# Patient Record
Sex: Female | Born: 1965 | Hispanic: Yes | Marital: Married | Smoking: Never smoker
Health system: Southern US, Community
[De-identification: ages and names within clinical notes are randomized; demographics above are authoritative.]

## PROBLEM LIST (undated history)

## (undated) DIAGNOSIS — E7800 Pure hypercholesterolemia, unspecified: Secondary | ICD-10-CM

## (undated) DIAGNOSIS — I10 Essential (primary) hypertension: Secondary | ICD-10-CM

## (undated) HISTORY — PX: TUBAL LIGATION: SHX77

---

## 2011-12-09 DIAGNOSIS — E7800 Pure hypercholesterolemia, unspecified: Secondary | ICD-10-CM | POA: Insufficient documentation

## 2011-12-09 DIAGNOSIS — S335XXA Sprain of ligaments of lumbar spine, initial encounter: Secondary | ICD-10-CM | POA: Insufficient documentation

## 2011-12-09 DIAGNOSIS — Y9289 Other specified places as the place of occurrence of the external cause: Secondary | ICD-10-CM | POA: Insufficient documentation

## 2011-12-09 DIAGNOSIS — X500XXA Overexertion from strenuous movement or load, initial encounter: Secondary | ICD-10-CM | POA: Insufficient documentation

## 2011-12-09 DIAGNOSIS — I10 Essential (primary) hypertension: Secondary | ICD-10-CM | POA: Insufficient documentation

## 2011-12-09 DIAGNOSIS — Y990 Civilian activity done for income or pay: Secondary | ICD-10-CM | POA: Insufficient documentation

## 2011-12-10 ENCOUNTER — Emergency Department (HOSPITAL_COMMUNITY)
Admission: EM | Admit: 2011-12-10 | Discharge: 2011-12-10 | Disposition: A | Discharge status: Home / Self Care | Payer: Worker's Compensation | Attending: Emergency Medicine | Admitting: Emergency Medicine

## 2011-12-10 ENCOUNTER — Encounter (HOSPITAL_COMMUNITY): Payer: Self-pay | Admitting: Emergency Medicine

## 2011-12-10 DIAGNOSIS — S39012A Strain of muscle, fascia and tendon of lower back, initial encounter: Secondary | ICD-10-CM

## 2011-12-10 HISTORY — DX: Essential (primary) hypertension: I10

## 2011-12-10 HISTORY — DX: Pure hypercholesterolemia, unspecified: E78.00

## 2011-12-10 MED ORDER — NAPROXEN 500 MG PO TABS: 500.0000 mg | ORAL_TABLET | Freq: Two times a day (BID) | ORAL | Status: AC

## 2011-12-10 MED ORDER — KETOROLAC TROMETHAMINE 60 MG/2ML IM SOLN
60.0000 mg | Freq: Once | INTRAMUSCULAR | Status: AC
  Administered 2011-12-10: 60 mg via INTRAMUSCULAR
  Filled 2011-12-10: qty 2

## 2011-12-10 MED ORDER — METHOCARBAMOL 500 MG PO TABS: 500.0000 mg | ORAL_TABLET | Freq: Two times a day (BID) | ORAL | Status: AC | PRN

## 2011-12-10 NOTE — ED Provider Notes (Signed)
History     CSN: [id number]  Arrival date & time 12/09/11  2319   First MD Initiated Contact with Patient 12/10/11 0117      Chief Complaint  Patient presents with  . Back Pain    (Consider location/radiation/quality/duration/timing/severity/associated sxs/prior treatment) HPI Comments: 46 year old female who presents with lower back pain which is on the right lower back. This was gradual in onset 4 days prior to arrival when she was lifting heavy items at work. The pain has been persistent, is relieved when she lays down and worsens when she turns or rotates or bends at the back. She denies associated weakness, numbness, ataxia, dysuria, fever, history of cancer, urinary retention or incontinence. She denies IV drug use. She has had no medications prior to arrival  Patient is a 46 y.o. female presenting with back pain. The history is provided by the patient and a relative. The history is limited by a language barrier. A language interpreter was used.  Back Pain  Pertinent negatives include no fever, no numbness and no weakness.    Past Medical History  Diagnosis Date  . Hypertension   . High cholesterol     Past Surgical History  Procedure Date  . Tubal ligation     History reviewed. No pertinent family history.  History  Substance Use Topics  . Smoking status: Never Smoker   . Smokeless tobacco: Not on file  . Alcohol Use: No    OB History    Grav Para Term Preterm Abortions TAB SAB Ect Mult Living                  Review of Systems  Constitutional: Negative for fever and chills.  HENT: Negative for neck pain.   Cardiovascular: Negative for leg swelling.  Gastrointestinal: Negative for nausea and vomiting.       No incontinence of bowel  Genitourinary: Negative for difficulty urinating.       No incontinence or retention  Musculoskeletal: Positive for back pain.  Skin: Negative for rash.  Neurological: Negative for weakness and numbness.    Allergies    Review of patient's allergies indicates no known allergies.  Home Medications   Current Outpatient Rx  Name Route Sig Dispense Refill  . ACETAMINOPHEN 325 MG PO TABS Oral Take 650 mg by mouth every 6 (six) hours as needed. Pain    . IBUPROFEN 200 MG PO TABS Oral Take 400 mg by mouth every 6 (six) hours as needed. Pain    . METHOCARBAMOL 500 MG PO TABS Oral Take 1 tablet (500 mg total) by mouth 2 (two) times daily as needed. 20 tablet 0  . NAPROXEN 500 MG PO TABS Oral Take 1 tablet (500 mg total) by mouth 2 (two) times daily with a meal. 30 tablet 0    BP 122/76  Pulse 78  Temp(Src) 98.4 F (36.9 C) (Oral)  Resp 16  SpO2 100%  LMP 11/13/2011  Physical Exam  Nursing note and vitals reviewed. Constitutional: She appears well-developed and well-nourished. No distress.  HENT:  Head: Normocephalic and atraumatic.  Eyes: Conjunctivae are normal. Right eye exhibits no discharge. Left eye exhibits no discharge. No scleral icterus.  Cardiovascular: Normal rate and regular rhythm.   No murmur heard. Pulmonary/Chest: Effort normal and breath sounds normal.  Musculoskeletal: She exhibits tenderness ( Focal tenderness to palpation over the right lower back, no spinal tenderness to palpation). She exhibits no edema.  Neurological:       Normal strength and  sensation and gait of the lower extremities.  Skin: Skin is warm and dry. She is not diaphoretic.    ED Course  Procedures (including critical care time)  Labs Reviewed - No data to display No results found.   1. Lumbar strain       MDM  No focal neurologic defects, vital signs normal, no urinary symptoms, no red flags for pathologic back pain. Intramuscular Toradol given, home with Naprosyn and Robaxin and followup.  Discharge Prescriptions include:  #1 Naprosyn #2 Toradol        Vida Roller, MD 12/10/11 [phone]

## 2011-12-10 NOTE — ED Notes (Signed)
Family states pt states she was at work and was moving some plates from the freezer and after that she started having pain in her lower back

## 2011-12-10 NOTE — Discharge Instructions (Signed)
Your back pain should be treated with medicines such as ibuprofen or aleve and this back pain should get better over the next 2 weeks.  However if you develop severe or worsening pain, low back pain with fever, numbness, weakness or inability to walk or urinate, you should return to the ER immediately.  Please follow up with your doctor this week for a recheck if still having symptoms.  RESOURCE GUIDE  Dental Problems  Patients with Medicaid: [facility] [phone] [address]                                           [phone] [address] Phone:  [phone]                                                  Phone:  [phone]  If unable to pay or uninsured, contact:  [facility] or [facility]. to become qualified for the adult dental clinic.  Chronic Pain Problems Contact [facility]  [phone] Patients need to be referred by their primary care doctor.  Insufficient Money for Medicine Contact [employer]:  call "211" or [facility] [phone].  No Primary Care Doctor Call [facility]  [phone] Other agencies that provide inexpensive medical care    [facility]  [phone]    [facility]  [phone]    [facility]  [phone]    [facility]  [phone]    [facility]  [phone]    [facility]  [phone]  [facility]al [facility]  [phone] [facility]  [phone] [facility]   [phone] (emergency services [phone])  Substance Abuse Resources [facility]  [phone] [facility] [phone] [facility] [phone] Floydene Flock [phone] Residential & Outpatient Substance Abuse Program  [phone]  Abuse/Neglect [facility] Child Abuse Hotline [phone] [facility] Child Abuse Hotline [phone] (After Hours)  Emergency  Shelter [facility] [phone]  [facility] [place] [facility] [phone] [facility] [phone]  MRSA Hotline #:   5134417742    [facility] Resources  [facility]     [employer]                          [facility] Dept. [address] [place]                       [address]      [address] [place] [address]  Patrecia Pace  [facility] Phone:  [phone]                                   Phone:  [phone]                 Phone:  [place] Phone:  [place] [phone] (After Hours)

## 2012-01-31 ENCOUNTER — Ambulatory Visit (INDEPENDENT_AMBULATORY_CARE_PROVIDER_SITE_OTHER): Payer: Self-pay | Admitting: Family Medicine

## 2012-01-31 VITALS — BP 121/83 | HR 105 | Temp 98.4°F | Resp 18 | Ht 63.0 in | Wt 180.0 lb

## 2012-01-31 DIAGNOSIS — R300 Dysuria: Secondary | ICD-10-CM

## 2012-01-31 DIAGNOSIS — N390 Urinary tract infection, site not specified: Secondary | ICD-10-CM

## 2012-01-31 LAB — POCT URINALYSIS DIPSTICK
Bilirubin, UA: NEGATIVE
Glucose, UA: NEGATIVE
Ketones, UA: NEGATIVE
Spec Grav, UA: <=1.005

## 2012-01-31 LAB — POCT UA - MICROSCOPIC ONLY
Casts, Ur, LPF, POC: NEGATIVE
Mucus, UA: NEGATIVE

## 2012-01-31 MED ORDER — CIPROFLOXACIN HCL 250 MG PO TABS: 250.0000 mg | ORAL_TABLET | Freq: Two times a day (BID) | ORAL | Status: AC

## 2012-01-31 MED ORDER — CIPROFLOXACIN HCL 250 MG PO TABS: 500.0000 mg | ORAL_TABLET | Freq: Two times a day (BID) | ORAL | Status: DC

## 2012-01-31 MED ORDER — PHENAZOPYRIDINE HCL 200 MG PO TABS: 200.0000 mg | ORAL_TABLET | Freq: Three times a day (TID) | ORAL | Status: AC | PRN

## 2012-01-31 NOTE — Patient Instructions (Signed)
Infeccin del tracto urinario (Urinary Tract Infection) Las infecciones en el tracto urinario pueden comenzar en varios lugares. Una infeccin en la vejiga (cistitis), una infeccin en el rin (pielonefritis) o una infeccin en la prstata (prostatitis) son diferentes tipos de infeccin del tracto urinario. Por lo general mejoran si se los trata con antibiticos. Los antibiticos son medicamentos que matan grmenes. Tome todos los medicamentos que le han recetado hasta que se terminen. Podr sentirse bien dentro de unos das, pero DEBE TOMAR LOS MEDICAMENTOS HASTA TERMINAR EL TRATAMIENTO, de lo contrario la infeccin puede no solucionarse y luego ser ms difcil de tratar. INSTRUCCIONES PARA EL CUIDADO DOMICILIARIO  Beba gran cantidad de lquidos para mantener la orina de tono claro o color amarillo plido. Se recomienda especialmente el jugo de arndanos rojos, adems de grandes cantidades de agua.   Evite la cafena, el t y las bebidas con gas. Estas sustancias irritan la vejiga.   El alcohol puede irritar la prstata.   Utilice los medicamentos de venta libre o de prescripcin para el dolor, el malestar o la fiebre, segn se lo indique el profesional que lo asiste.  PARA PREVENIR FUTURAS INFECCIONES:  Vace la vejiga con frecuencia. Evite retener la orina durante largos perodos.   Despus de mover el intestino, las mujeres deben higienizarse la regin perineal desde adelante hacia atrs. Use cada papel tissue slo una vez.   Vace la vejiga antes y despus de tener relaciones sexuales.  OBTENER LOS RESULTADOS DE LAS PRUEBAS Durante su visita no contar con todos los resultados de los anlisis. En este caso, tenga otra entrevista con su mdico para conocerlos. No piense que el resultado es normal si no tiene noticias de su mdico o de la institucin mdica. Es importante el seguimiento de todos los resultados de los anlisis.  SOLICITE ATENCIN MDICA SI:  Siente dolor en la espalda.    El beb tiene ms de 3 meses y su temperatura rectal es de 100.5 F (38.1 C) o ms durante ms de 1 da.   Los problemas (sntomas) no mejoran en 3 das. Solicite atencin mdica antes si empeora.  SOLICITE ATENCIN MDICA DE INMEDIATO SI:  Comienza a sentir un dolor de espaldas o en la zona abdominal inferior intenso.   Comienza a sentir escalofros.   Tiene fiebre.   Su beb tiene ms de 3 meses y su temperatura rectal es de 102 F (38.9 C) o mayor.   Su beb tiene 3 meses o menos y su temperatura rectal es de 100.4 F (38 C) o mayor.   Siente nuseas o vmitos.   Tiene una sensacin continua de quemazn o molestias al orinar.  EST SEGURO QUE:  Comprende las instrucciones para el alta mdica.   Controlar su enfermedad.   Solicitar atencin mdica de inmediato segn las indicaciones.  Document Released: 06/11/2005 Document Revised: 08/21/2011 ExitCare Patient Information 2012 [employer]. 

## 2012-01-31 NOTE — Progress Notes (Signed)
    Results for orders placed in visit on 01/31/12  POCT UA - MICROSCOPIC ONLY      Component Value Range   WBC, Ur, HPF, POC tntc     RBC, urine, microscopic tntc     Bacteria, U Microscopic 3+     Mucus, UA neg     Epithelial cells, urine per micros 0-1     Crystals, Ur, HPF, POC neg     Casts, Ur, LPF, POC neg     Yeast, UA neg    POCT URINALYSIS DIPSTICK      Component Value Range   Color, UA pink     Clarity, UA cloudy     Glucose, UA neg     Bilirubin, UA neg     Ketones, UA neg     Spec Grav, UA <=1.005     Blood, UA large     pH, UA 6.5     Protein, UA 30     Urobilinogen, UA 0.2     Nitrite, UA neg     Leukocytes, UA moderate (2+)

## 2012-07-19 ENCOUNTER — Ambulatory Visit: Payer: Self-pay | Admitting: Family Medicine

## 2012-07-19 VITALS — BP 128/82 | HR 61 | Temp 97.7°F | Resp 16 | Ht 64.0 in | Wt 181.0 lb

## 2012-07-19 DIAGNOSIS — Z124 Encounter for screening for malignant neoplasm of cervix: Secondary | ICD-10-CM

## 2012-07-19 DIAGNOSIS — N898 Other specified noninflammatory disorders of vagina: Secondary | ICD-10-CM

## 2012-07-19 DIAGNOSIS — Z13220 Encounter for screening for lipoid disorders: Secondary | ICD-10-CM

## 2012-07-19 DIAGNOSIS — R109 Unspecified abdominal pain: Secondary | ICD-10-CM

## 2012-07-19 DIAGNOSIS — N912 Amenorrhea, unspecified: Secondary | ICD-10-CM

## 2012-07-19 LAB — POCT URINALYSIS DIPSTICK
Bilirubin, UA: NEGATIVE
Glucose, UA: NEGATIVE
Ketones, UA: NEGATIVE
Leukocytes, UA: NEGATIVE
Nitrite, UA: NEGATIVE
Protein, UA: NEGATIVE
Spec Grav, UA: 1.01
Urobilinogen, UA: 0.2
pH, UA: 6

## 2012-07-19 LAB — LIPID PANEL
Cholesterol: 323 mg/dL — ABNORMAL HIGH (ref 0–200)
Total CHOL/HDL Ratio: 7.3 Ratio

## 2012-07-19 LAB — POCT CBC
Granulocyte percent: 63.9 %G (ref 37–80)
MCHC: 30.8 g/dL — AB (ref 31.8–35.4)
MCV: 84.8 fL (ref 80–97)
MID (cbc): 0.4 (ref 0–0.9)
POC Granulocyte: 5.1 (ref 2–6.9)
POC LYMPH PERCENT: 30.5 %L (ref 10–50)
Platelet Count, POC: 328 10*3/uL (ref 142–424)
RDW, POC: 14.7 %

## 2012-07-19 LAB — POCT UA - MICROSCOPIC ONLY
Casts, Ur, LPF, POC: NEGATIVE
Crystals, Ur, HPF, POC: NEGATIVE
Mucus, UA: NEGATIVE
Yeast, UA: NEGATIVE

## 2012-07-19 LAB — POCT WET PREP WITH KOH
KOH Prep POC: POSITIVE
Trichomonas, UA: NEGATIVE

## 2012-07-19 LAB — BASIC METABOLIC PANEL
BUN: 8 mg/dL (ref 6–23)
CO2: 25 mEq/L (ref 19–32)
Chloride: 101 mEq/L (ref 96–112)
Glucose, Bld: 86 mg/dL (ref 70–99)
Potassium: 3.9 mEq/L (ref 3.5–5.3)
Sodium: 138 mEq/L (ref 135–145)

## 2012-07-19 LAB — POCT URINE PREGNANCY: Preg Test, Ur: NEGATIVE

## 2012-07-19 LAB — TSH: TSH: 1.97 u[IU]/mL (ref 0.350–4.500)

## 2012-07-19 MED ORDER — CIPROFLOXACIN HCL 250 MG PO TABS: 250.0000 mg | ORAL_TABLET | Freq: Two times a day (BID) | ORAL | Status: DC

## 2012-07-19 MED ORDER — FLUCONAZOLE 150 MG PO TABS: 150.0000 mg | ORAL_TABLET | Freq: Once | ORAL | Status: DC

## 2012-07-19 NOTE — Patient Instructions (Addendum)
You may be developing a urinary tract infection. Please use the cipro antibiotic and also the pill for a vaginal yeast infection.  Otherwise I do not see a definite reason for your abdominal and back pain.  If you are not getting better in the next 24 hours please call or come back in- in that case we may need to do an ultrasound or CT scan.

## 2012-07-19 NOTE — Progress Notes (Signed)
[facility] [address] [place] [address] [phone]  Date:  07/19/2012   Name:  Cathy Duran   DOB:  1966-06-08   MRN:  [id number]  PCP:  No primary provider on file.    Chief Complaint: Dysmenorrhea, Headache and Abdominal Pain   History of Present Illness:  Cathy Duran is a 46 y.o. very pleasant female patient who presents with the following:  She is here today for a few different problems.  Her LMP was about 2 months ago-  in August.  She has not had any bleeding since then. Her menses had been regular in the past.    She also notes headaches for about 2 weeks.  These occur nearly every day.  She has pain in her back, and pain in her lower abdomen.  She has noted pain in her lower abdomen since yesterday- she feels bloated like she needs to have a bowel movement.  No dysuria.  She is having normal stools.   Last week she also had a cough which is now better.    History of BTL- no other surgical history.    She does not occasional vaginal symptoms such as itching and burning but this does not occur all the time.    She cannot sleep at night- this is not new.  She has suffered from insomnia for some time.   She is not sure of her mother's age at menopause.    They have 3 children- 24, 71, and 76 years old.   She has been able to eat ok- did not eat yet this morning because she thought she might need to be fasting. She would like for [place] to do a cholesterol panel today.  No nausea, vomiting or diarrhea.  No fever.    There is no problem list on file for this patient.   Past Medical History  Diagnosis Date  . Hypertension   . High cholesterol   . Depression     Past Surgical History  Procedure Date  . Tubal ligation     History  Substance Use Topics  . Smoking status: Never Smoker   . Smokeless tobacco: Never Used  . Alcohol Use: No    Family History  Problem Relation Age of Onset  . Diabetes Mother   . Diabetes  Father   . Hypertension Sister     No Known Allergies  Medication list has been reviewed and updated.  Current Outpatient Prescriptions on File Prior to Visit  Medication Sig Dispense Refill  . acetaminophen (TYLENOL) 325 MG tablet Take 650 mg by mouth every 6 (six) hours as needed. Pain      . ibuprofen (ADVIL,MOTRIN) 200 MG tablet Take 400 mg by mouth every 6 (six) hours as needed. Pain      . naproxen (NAPROSYN) 500 MG tablet Take 1 tablet (500 mg total) by mouth 2 (two) times daily with a meal.  30 tablet  0   Current Facility-Administered Medications on File Prior to Visit  Medication Dose Route Frequency Provider Last Rate Last Dose  . ciprofloxacin (CIPRO) tablet 500 mg  500 mg Oral BID Elvina Sidle, MD        Review of Systems:  As per HPI- otherwise negative.   Physical Examination: Filed Vitals:   07/19/12 0924  BP: 128/82  Pulse: 61  Temp: 97.7 F (36.5 C)  Resp: 16   Filed Vitals:   07/19/12 0924  Height: 5\' 4"  (1.626 m)  Weight: 181 lb (82.101 kg)   Body mass index is 31.07 kg/(m^2). Ideal Body Weight: Weight in (lb) to have BMI = 25: 145.3   GEN: WDWN, NAD, Non-toxic, A & O x 3, overweight, looks well HEENT: Atraumatic, Normocephalic. Neck supple. No masses, No LAD.  Bilateral TM wnl, oropharynx normal.  PEERL,EOMI.   Ears and Nose: No external deformity. CV: RRR, No M/G/R. No JVD. No thrill. No extra heart sounds. PULM: CTA B, no wheezes, crackles, rhonchi. No retractions. No resp. distress. No accessory muscle use. ABD: S,  ND, +BS. No rebound. No HSM. She has minimal pain in her LLQ and over her bladder EXTR: No c/c/e NEURO Normal gait. Normal strength and movement of all extremities, normal DTR PSYCH: Normally interactive. Conversant. Not depressed or anxious appearing.  Calm demeanor.  GU: normal external exam, normal cervix and no CMT.  Uterus feels normal and small.    Results for orders placed in visit on 07/19/12  POCT UA - MICROSCOPIC  ONLY      Component Value Range   WBC, Ur, HPF, POC 0-1     RBC, urine, microscopic 2-4     Bacteria, U Microscopic trace     Mucus, UA neg     Epithelial cells, urine per micros 0-3     Crystals, Ur, HPF, POC neg     Casts, Ur, LPF, POC neg     Yeast, UA neg    POCT URINALYSIS DIPSTICK      Component Value Range   Color, UA yellow     Clarity, UA clear     Glucose, UA neg     Bilirubin, UA neg     Ketones, UA neg     Spec Grav, UA 1.010     Blood, UA small     pH, UA 6.0     Protein, UA neg     Urobilinogen, UA 0.2     Nitrite, UA neg     Leukocytes, UA Negative    POCT URINE PREGNANCY      Component Value Range   Preg Test, Ur Negative    POCT CBC      Component Value Range   WBC 8.0  4.6 - 10.2 K/uL   Lymph, poc 2.4  0.6 - 3.4   POC LYMPH PERCENT 30.5  10 - 50 %L   MID (cbc) 0.4  0 - 0.9   POC MID % 5.6  0 - 12 %M   POC Granulocyte 5.1  2 - 6.9   Granulocyte percent 63.9  37 - 80 %G   RBC 5.09  4.04 - 5.48 M/uL   Hemoglobin 13.3  12.2 - 16.2 g/dL   HCT, POC 40.9  81.1 - 47.9 %   MCV 84.8  80 - 97 fL   MCH, POC 26.1 (*) 27 - 31.2 pg   MCHC 30.8 (*) 31.8 - 35.4 g/dL   RDW, POC 91.4     Platelet Count, POC 328  142 - 424 K/uL   MPV 8.8  0 - 99.8 fL  GLUCOSE, POCT (MANUAL RESULT ENTRY)      Component Value Range   POC Glucose 86  70 - 99 mg/dl  POCT WET PREP WITH KOH      Component Value Range   Trichomonas, UA Negative     Clue Cells Wet Prep HPF POC 0-3     Epithelial Wet Prep HPF POC 5-7     Yeast Wet Prep HPF POC  neg     Bacteria Wet Prep HPF POC 1+     RBC Wet Prep HPF POC 0-1     WBC Wet Prep HPF POC 0-4     KOH Prep POC Positive      Assessment and Plan: 1. Abdominal pain  POCT UA - Microscopic Only, POCT urinalysis dipstick, POCT urine pregnancy, POCT CBC, POCT glucose (manual entry), POCT Wet Prep with KOH, ciprofloxacin (CIPRO) 250 MG tablet, Urine culture, Basic metabolic panel  2. Cervical cancer screening  Pap IG, CT/NG w/ reflex HPV when  ASC-U  3. Amenorrhea  TSH  4. Vaginal discharge  fluconazole (DIFLUCAN) 150 MG tablet  5. Screening cholesterol level  Lipid panel   Cathy Duran is here with a few symptoms today.  Most concerning is her amenorrhea and abdominal pain.  Negative HCG today.  Her abdominal exam and CBC are reassuring.   She may have a mild UTI- will treat with cipro while I await her urine culture. Also will treat with diflucan for vaginal yeast infection.  Check TSH due to amenorrhea.  Could be idiopathic or stress related.  Pap also pending.  BMP to evaluate kidney function and FLP per her request.    Discussed in detail with pt and her husband.  I am not able to definitely diagnose her abdominal pain, and imaging may be necessary.  Offered to do a CT or ultrasound.  However, they would prefer to try treatment first and let me know if not better in 24 hours.  If she does not get better an ovarian cyst is the most likely cause. Double diverticulitis due to normal wbc count.  They know to call or seek care right away if she gets worse, has severe pain, nausea, vomiting or fever.    Abbe Amsterdam, MD

## 2012-07-21 ENCOUNTER — Telehealth: Payer: Self-pay | Admitting: Family Medicine

## 2012-07-21 NOTE — Telephone Encounter (Signed)
Called and LM on her cell phone- still waiting on labs. Call if any problems

## 2012-07-22 LAB — PAP IG, CT-NG, RFX HPV ASCU

## 2012-07-23 LAB — URINE CULTURE: Colony Count: 30000

## 2012-07-24 ENCOUNTER — Encounter: Payer: Self-pay | Admitting: Family Medicine

## 2014-06-19 ENCOUNTER — Ambulatory Visit (INDEPENDENT_AMBULATORY_CARE_PROVIDER_SITE_OTHER): Payer: PRIVATE HEALTH INSURANCE | Admitting: Family Medicine

## 2014-06-19 VITALS — BP 126/78 | HR 70 | Temp 98.0°F | Resp 17 | Ht 63.5 in | Wt 178.0 lb

## 2014-06-19 DIAGNOSIS — G4700 Insomnia, unspecified: Secondary | ICD-10-CM

## 2014-06-19 DIAGNOSIS — R51 Headache: Secondary | ICD-10-CM

## 2014-06-19 DIAGNOSIS — R5383 Other fatigue: Secondary | ICD-10-CM

## 2014-06-19 DIAGNOSIS — R519 Headache, unspecified: Secondary | ICD-10-CM

## 2014-06-19 LAB — COMPLETE METABOLIC PANEL WITHOUT GFR
ALT: 63 U/L — ABNORMAL HIGH (ref 0–35)
AST: 42 U/L — ABNORMAL HIGH (ref 0–37)
Albumin: 4.7 g/dL (ref 3.5–5.2)
Calcium: 9.8 mg/dL (ref 8.4–10.5)
Chloride: 101 meq/L (ref 96–112)
Potassium: 4.6 meq/L (ref 3.5–5.3)
Sodium: 137 meq/L (ref 135–145)
Total Protein: 7.7 g/dL (ref 6.0–8.3)

## 2014-06-19 LAB — COMPLETE METABOLIC PANEL WITH GFR
Alkaline Phosphatase: 100 U/L (ref 39–117)
BUN: 14 mg/dL (ref 6–23)
CO2: 29 mEq/L (ref 19–32)
Creat: 0.62 mg/dL (ref 0.50–1.10)
GFR, Est African American: >89 mL/min
GFR, Est Non African American: >89 mL/min
Glucose, Bld: 97 mg/dL (ref 70–99)
Total Bilirubin: 0.4 mg/dL (ref 0.2–1.2)

## 2014-06-19 LAB — POCT CBC
Granulocyte percent: 54 %G (ref 37–80)
HCT, POC: 43.2 % (ref 37.7–47.9)
Hemoglobin: 14.2 g/dL (ref 12.2–16.2)
Lymph, poc: 2.6 (ref 0.6–3.4)
MCH, POC: 27.5 pg (ref 27–31.2)
MCHC: 32.8 g/dL (ref 31.8–35.4)
MCV: 83.7 fL (ref 80–97)
MID (cbc): 0.5 (ref 0–0.9)
MPV: 7.7 fL (ref 0–99.8)
POC Granulocyte: 3.7 (ref 2–6.9)
POC LYMPH PERCENT: 38.9 %L (ref 10–50)
POC MID %: 7.1 % (ref 0–12)
Platelet Count, POC: 257 10*3/uL (ref 142–424)
RBC: 5.16 M/uL (ref 4.04–5.48)
RDW, POC: 13.8 %
WBC: 6.8 10*3/uL (ref 4.6–10.2)

## 2014-06-19 LAB — TSH: TSH: 2.727 u[IU]/mL (ref 0.350–4.500)

## 2014-06-19 MED ORDER — ZOLPIDEM TARTRATE 5 MG PO TABS: 5.0000 mg | ORAL_TABLET | Freq: Every evening | ORAL | Status: DC | PRN

## 2014-06-19 NOTE — Patient Instructions (Signed)
Insomnio (Insomnia) El insomnio es un trastorno frecuente en la capacidad para dormirse o para permanecer dormido. Puede ser un problema crnico o un trastorno del momento. En ambos casos es un problema frecuente. Puede tratarse de un problema del momento cuando se relaciona con alguna situacin de estrs o preocupacin. Es un trastorno crnico cuando se relaciona con situaciones de estrs durante las horas de vigilia o con malos hbitos de sueo. Con el tiempo, la privacin del sueo en s misma, puede hacer que el problema empeore. Las cosas ms pequeas se agravan debido al cansancio y a que la capacidad para enfrentarlas disminuye. CAUSAS  Estrs, ansiedad y depresin.  Malos hbitos para dormir.  Distracciones como mirar TV en la cama.  Siestas en horarios prximos a la hora de ir a dormir.  Involucrarse en conversaciones de gran carga emocional antes de ir a dormir.  Leer textos tcnicos antes de dormir.  Consumir alcohol y otros sedantes. Ellos pueden empeorar el problema. Pueden modificar los patrones de sueo normales y la normal actividad onrica.  Consumir estimulantes como cafena algunas horas antes de ir a dormir.  Sndromes dolorosos y las dificultades respiratorias pueden causar insomnio.  Realizar ejercicios a ltima hora de la noche.  El cambio en las zonas horarias puede causar trastornos del sueo (jet lag). En algunos casos se recomienda que otra persona observe sus patrones de sueo. Deben observar los perodos en los que no respira durante la noche (apnea del sueo). Tambin deben observar cunto tiempo duran esos perodos. Si vive slo o no tiene una persona de confianza que lo observe, podr concurrir a una clnica del sueo, en la que lo controlarn de manera profesional. La apnea del sueo requiere controles y tratamiento. Entregue su historia clnica al profesional que lo asiste Tambin infrmele las observaciones que sus familiares hayan hecho con respecto a sus  hbitos de sueo.  SNTOMAS  Sentir por la maana que no ha descansado lo suficiente.  Ansiedad y agitacin a la hora de dormir  Dificultad para dormirse o para continuar el sueo. TRATAMIENTO  El profesional Airelle Everding indicar un tratamiento para los trastornos subyacentes. Tambin podr aconsejarlo o ayudarlo si usted toma alcohol o se automedica con otras drogas. El tratamiento de los problemas subyacentes generalmente eliminarn los problemas de insomnio.  Podrn prescribirle medicamentos para usar durante un plazo breve. Generalmente no se recomiendan para un uso prolongado.  Generalmente no se recomiendan los medicamentos de venta libre para un uso prolongado. Podran causarle adiccin.  Puede hacer ms fcil el conciliar el sueo si realiza modificaciones en su estilo de vida tales como:  Utilizar tcnicas de relajacin que favorecen la respiracin y reducen la tensin muscular.  No practicar actividad fsica en las ltimas horas del da.  Modificar la dieta y el horario de la ltima comida. No tomar colaciones durante la noche  Trate de establecer una hora habitual para irse a dormir.  La psicoterapia puede ser de utilidad para los problemas que Devonne Lalani ocasionan estrs y preocupaciones.  Si hay un ambiente ruidoso y no puede evitarlo, puede ser til escuchar msica suave o sonidos agradables.  Suspenda los trabajos tediosos y detallados al menos una hora antes de ir a dormir. INSTRUCCIONES PARA EL CUIDADO DOMICILIARIO  Lleve un diario. Infrmele al profesional que lo asiste sus progresos. Esto incluye todos los efectos secundarios de los medicamentos. Concurra regularmente a la consulta con el profesional [place] nota de:  La hora en que se duerme.  Las horas en las   que permanece despierto durante la noche.  La calidad del sueo.  Cmo se siente al da siguiente. Esta informacin ayudar a que el profesional pueda aconsejarlo.   Levntese de la cama si permanece despierto por  ms de 15 minutos. Lea o realice alguna actividad tranquila. Mantenga las luces bajas. Espere hasta que sienta sueo y luego vuelva a la cama.  Mantenga un ritmo constante de vigilia y sueo. Evite las siestas.  Practique actividad fsica con regularidad.  Evite las distracciones en el momento de ir a dormir. Entre las distracciones se incluyen el ver televisin o realizar alguna actividad intensa o detallada, hacer las cuentas de los gastos domsticos.  Programe un ritual para irse a dormir. Mantenga una rutina familiar relacionada con el bao diario, el cepillado de los dientes, irse a la cama todas las noches a la misma hora, escuchar msica suave. La rutina aumenta el xito de conciliar el sueo ms rpido.  Use tcnicas de relajacin. Practique rutinas que favorezcan la respiracin y alivien la tensin muscular. Tambin puede ayudarlo la visualizacin de escenas pacficas. Tambin puede tratar de controlar los pensamientos problemticos o molestos si mantiene la mente ocupada con pensamientos repetitivos o aburridos, como el antiguo consejo de contar ovejas. Tambin puede ser ms creativo, e imaginar que planta hermosas flores en su jardn, una detrs de la otra.  Durante el da, trabaje para eliminar el estrs. Cuando no es posible, algunas de las sugerencias ya presentadas lo ayudarn a reducir la ansiedad que acompaa las situaciones de estrs. EST SEGURO QUE:   Comprende las instrucciones para el alta mdica.  Controlar su enfermedad.  Solicitar atencin mdica de inmediato segn las indicaciones. Document Released: 09/01/2005 Document Revised: 11/24/2011 ExitCare Patient Information 2015 [employer]. This information is not intended to replace advice given to you by your health care provider. Make sure you discuss any questions you have with your health care provider.  

## 2014-06-19 NOTE — Progress Notes (Signed)
Chief Complaint:  Chief Complaint  Patient presents with  . Insomnia  . Headache  . Night Sweats    HPI: Cathy Duran is a 48 y.o. female who is here for :   3 month history of insomnia and intermittent Ha, daytime and also nighttime, in the nighttime she has a lot of chills and night sweats.  She has not lost in weight. NO cough sxs, no recent travels, no new medicines.   HEr sister feels similar to her but is living [place]  She had last LMP 2 months ago, she is irregular. Her periods are getting less She doe snot know when her mother went through menopause   She is more hot then cold.  She denies any CP, SOB, and feels tired and no energy No abd pain, no urination issues  She denies nay neuro sxs, She deneis nubmness, tingling or gait changes, or slurred speech.   She has HA and has taken tylenol or advil, she takes 1-2 pills, 2 times int he day, she has HAs started 1 month ago, 4 times out of the week. HAs last for 5 hours or less, frontal and temporal. She has tired eyes or irritation but no n/v. Noise makes it worse. She has been getting about 3 hours, and then she wakes up, then would have interrupted sleep. She has a hard time getting to sleep and maintaining sleep.  She work sin [employer] at [employer], 8 hour shifts at 12 midnight.   Past Medical History  Diagnosis Date  . Hypertension   . High cholesterol    Past Surgical History  Procedure Laterality Date  . Tubal ligation     History   Social History  . Marital Status: Married    Spouse Name: N/A    Number of Children: N/A  . Years of Education: N/A   Social History Main Topics  . Smoking status: Never Smoker   . Smokeless tobacco: Never Used  . Alcohol Use: No  . Drug Use: No  . Sexual Activity: Yes    Birth Control/ Protection: Surgical   Other Topics Concern  . None   Social History Narrative  . None   Family History  Problem Relation Age of Onset  .  Diabetes Mother   . Diabetes Father   . Hypertension Sister    No Known Allergies Prior to Admission medications   Not on File     ROS: The patient denies fevers, chills, night sweats, unintentional weight loss, chest pain, palpitations, wheezing, dyspnea on exertion, nausea, vomiting, abdominal pain, dysuria, hematuria, melena, numbness,  or tingling.   All other systems have been reviewed and were otherwise negative with the exception of those mentioned in the HPI and as above.    PHYSICAL EXAM: Filed Vitals:   06/19/14 0853  BP: 126/78  Pulse: 70  Temp: 98 F (36.7 C)  Resp: 17   Filed Vitals:   06/19/14 0853  Height: 5' 3.5" (1.613 m)  Weight: 178 lb (80.74 kg)   Body mass index is 31.03 kg/(m^2).  General: Alert, no acute distress HEENT:  Normocephalic, atraumatic, oropharynx patent. EOMI, PERRLA, no thyroidmegaly, TM nl Cardiovascular:  Regular rate and rhythm, no rubs murmurs or gallops.  No Carotid bruits, radial pulse intact. No pedal edema.  Respiratory: Clear to auscultation bilaterally.  No wheezes, rales, or rhonchi.  No cyanosis, no use of accessory musculature GI: No organomegaly, abdomen is soft and  non-tender, positive bowel sounds.  No masses. Skin: No rashes. Neurologic: Facial musculature symmetric. CN 2-12 grossly normal Psychiatric: Patient is appropriate throughout our interaction. Lymphatic: No cervical lymphadenopathy Musculoskeletal: Gait intact.   LABS: Results for orders placed in visit on 06/19/14  POCT CBC      Result Value Ref Range   WBC 6.8  4.6 - 10.2 K/uL   Lymph, poc 2.6  0.6 - 3.4   POC LYMPH PERCENT 38.9  10 - 50 %L   MID (cbc) 0.5  0 - 0.9   POC MID % 7.1  0 - 12 %M   POC Granulocyte 3.7  2 - 6.9   Granulocyte percent 54.0  37 - 80 %G   RBC 5.16  4.04 - 5.48 M/uL   Hemoglobin 14.2  12.2 - 16.2 g/dL   HCT, POC 13.243.2  44.037.7 - 47.9 %   MCV 83.7  80 - 97 fL   MCH, POC 27.5  27 - 31.2 pg   MCHC 32.8  31.8 - 35.4 g/dL   RDW,  POC 10.213.8     Platelet Count, POC 257  142 - 424 K/uL   MPV 7.7  0 - 99.8 fL     EKG/XRAY:   Primary [facility]   ASSESSMENT/PLAN: Encounter Diagnoses  Name Primary?  . Insomnia Yes  . Other fatigue   . Headache in front of head    Labs pending: CMP and TSH Will rx ambien 5 mg and see how she does, I am hoping sleep hygiene improvement will resolve her issues She may hase thyroid issues or is premenopausal F/u in 1 month  Gross sideeffects, risk and benefits, and alternatives of medications d/w patient. Patient is aware that all medications have potential sideeffects and we are unable to predict every sideeffect or drug-drug interaction that may occur.  Jak Haggar PHUONG, DO 06/19/2014 10:06 AM

## 2014-06-30 ENCOUNTER — Telehealth: Payer: Self-pay | Admitting: Family Medicine

## 2014-06-30 NOTE — Telephone Encounter (Signed)
Spoke with patient, she will return to clinic  For recheck on Monday , also will get for fasting blood work for Cholesterol.Advise not to eat anything in themorning. Headache is slightly better, she has hyperlipidemia and her liver enzymes are minimally elevated and I suspect it is related to a fatty liver. Insomnia is not very much different or better.

## 2014-07-10 ENCOUNTER — Ambulatory Visit (INDEPENDENT_AMBULATORY_CARE_PROVIDER_SITE_OTHER): Payer: PRIVATE HEALTH INSURANCE | Admitting: Family Medicine

## 2014-07-10 VITALS — BP 124/80 | HR 64 | Temp 98.6°F | Resp 16 | Ht 60.35 in | Wt 179.0 lb

## 2014-07-10 DIAGNOSIS — R748 Abnormal levels of other serum enzymes: Secondary | ICD-10-CM

## 2014-07-10 DIAGNOSIS — Z658 Other specified problems related to psychosocial circumstances: Secondary | ICD-10-CM

## 2014-07-10 DIAGNOSIS — G4700 Insomnia, unspecified: Secondary | ICD-10-CM

## 2014-07-10 DIAGNOSIS — F439 Reaction to severe stress, unspecified: Secondary | ICD-10-CM

## 2014-07-10 DIAGNOSIS — Z13220 Encounter for screening for lipoid disorders: Secondary | ICD-10-CM

## 2014-07-10 LAB — COMPLETE METABOLIC PANEL WITH GFR
Albumin: 4.6 g/dL (ref 3.5–5.2)
Alkaline Phosphatase: 92 U/L (ref 39–117)
BUN: 11 mg/dL (ref 6–23)
CO2: 28 mEq/L (ref 19–32)
GFR, Est African American: >89 mL/min
GFR, Est Non African American: >89 mL/min
Glucose, Bld: 99 mg/dL (ref 70–99)
Potassium: 4.6 mEq/L (ref 3.5–5.3)
Sodium: 139 mEq/L (ref 135–145)
Total Protein: 7.8 g/dL (ref 6.0–8.3)

## 2014-07-10 LAB — COMPLETE METABOLIC PANEL WITHOUT GFR
ALT: 55 U/L — ABNORMAL HIGH (ref 0–35)
AST: 36 U/L (ref 0–37)
Calcium: 9.4 mg/dL (ref 8.4–10.5)
Chloride: 103 meq/L (ref 96–112)
Creat: 0.61 mg/dL (ref 0.50–1.10)
Total Bilirubin: 0.4 mg/dL (ref 0.2–1.2)

## 2014-07-10 LAB — LIPID PANEL
Cholesterol: 389 mg/dL — ABNORMAL HIGH (ref 0–200)
HDL: 49 mg/dL (ref >39)
LDL Cholesterol: 316 mg/dL — ABNORMAL HIGH (ref 0–99)
Total CHOL/HDL Ratio: 7.9 ratio
Triglycerides: 122 mg/dL (ref <150)
VLDL: 24 mg/dL (ref 0–40)

## 2014-07-10 MED ORDER — CLONAZEPAM 0.5 MG PO TABS: ORAL_TABLET | ORAL | Status: DC

## 2014-07-10 NOTE — Progress Notes (Signed)
 Chief Complaint:  Chief Complaint  Patient presents with  . Follow-up    With Dr. Conley RollsLe.   . Medication Refill    Pt needs a refill for Ambien.     HPI: Cathy Duran is a 48 y.o. female who is here for  Recheck of liver enzymes and also cholesterol, has not eaten today and also  [place]ants a [place]. The ambien 5 mg qhs is working and allows her to sleep  about 3 hrs and then she wakes up and is worried. Then she can;t go back to work.  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  She was here about 1 month ago and labs were pretty unremarkable CBC, TSh nl, CMp showed mild elevated liver enzymes. She has a hx of cholesterol and was put on cholesterol medicine and stopped using it because she felt all the [facility] patient that was going to the same clinic was gettign the same medications, the clinic next dooor to ur practice. So she stopped.   Last OV 06/19/2014  3 month history of insomnia and intermittent Ha, daytime and also nighttime, in the nighttime she has a lot of chills and night sweats.  She has not lost in weight. NO cough sxs, no recent travels, no new medicines.  HEr sister feels similar to her but is living in [place], [place].  She had last LMP 2 months ago, she is irregular. Her periods are getting less  She doe snot know when her mother went through menopause  She is more hot then cold.  She denies any CP, SOB, and feels tired and no energy  No abd pain, no urination issues  She denies nay neuro sxs, She deneis nubmness, tingling or gait changes, or slurred speech.  She has HA and has taken tylenol or advil, she takes 1-2 pills, 2 times int he day, she has HAs started 1 month ago, 4 times out of the week. HAs last for 5 hours or less, frontal and temporal. She has tired eyes or irritation but no n/v. Noise makes it worse. She has been getting about 3 hours, and then she wakes up, then would have interrupted  sleep. She has a hard time getting to sleep and maintaining sleep. She work [employer], 8 hour shifts at 12 midnight.    Past Medical History  Diagnosis Date  . Hypertension   . High cholesterol    Past Surgical History  Procedure Laterality Date  . Tubal ligation     History   Social History  . Marital Status: Married    Spouse Name: N/A    Number of Children: N/A  . Years of Education: N/A   Social History Main Topics  . Smoking status: Never Smoker   . Smokeless tobacco: Never Used  . Alcohol Use: No  . Drug Use: No  . Sexual Activity: Yes    Birth Control/ Protection: Surgical   Other Topics Concern  . None   Social History Narrative  . None   Family History  Problem Relation Age of Onset  . Diabetes Mother   . Diabetes Father   . Hypertension Sister    No Known Allergies Prior to Admission medications   Medication Sig Start Date End Date Taking? Authorizing Provider  zolpidem (AMBIEN) 5 MG tablet Take 1 tablet (5 mg total) by mouth at bedtime as needed for  sleep. 06/19/14  Yes  P , DO  clonazePAM (KLONOPIN) 0.5 MG tablet 1/2-1 tab po qhs 07/10/14    P , DO     ROS: The patient denies fevers, chills, night sweats, unintentional weight loss, chest pain, palpitations, wheezing, dyspnea on exertion, nausea, vomiting, abdominal pain, dysuria, hematuria, melena, numbness, weakness, or tingling.   All other systems have been reviewed and were otherwise negative with the exception of those mentioned in the HPI and as above.    PHYSICAL EXAM: Filed Vitals:   07/10/14 0912  BP: 124/80  Pulse: 64  Temp: 98.6 F (37 C)  Resp: 16   Filed Vitals:   07/10/14 0912  Height: 5' 0.35" (1.533 m)  Weight: 179 lb (81.194 kg)   Body mass index is 34.55 kg/(m^2).  General: Alert, no acute distress HEENT:  Normocephalic, atraumatic, oropharynx patent. EOMI, PERRLA Cardiovascular:  Regular rate and rhythm, no rubs murmurs or gallops.  No  Carotid bruits, radial pulse intact. No pedal edema.  Respiratory: Clear to auscultation bilaterally.  No wheezes, rales, or rhonchi.  No cyanosis, no use of accessory musculature GI: No organomegaly, abdomen is soft and non-tender, positive bowel sounds.  No masses. Skin: No rashes. Neurologic: Facial musculature symmetric. Psychiatric: Patient is appropriate throughout our interaction. Lymphatic: No cervical lymphadenopathy Musculoskeletal: Gait intact.   LABS: Results for orders placed in visit on 06/19/14  COMPLETE METABOLIC PANEL WITH GFR      Result Value Ref Range   Sodium 137  135 - 145 mEq/L   Potassium 4.6  3.5 - 5.3 mEq/L   Chloride 101  96 - 112 mEq/L   CO2 29  19 - 32 mEq/L   Glucose, Bld 97  70 - 99 mg/dL   BUN 14  6 - 23 mg/dL   Creat 1.91  4.78 - 2.95 mg/dL   Total Bilirubin 0.4  0.2 - 1.2 mg/dL   Alkaline Phosphatase 100  39 - 117 U/L   AST 42 (*) 0 - 37 U/L   ALT 63 (*) 0 - 35 U/L   Total Protein 7.7  6.0 - 8.3 g/dL   Albumin 4.7  3.5 - 5.2 g/dL   Calcium 9.8  8.4 - 62.1 mg/dL   GFR, Est African American >89     GFR, Est Non African American >89    TSH      Result Value Ref Range   TSH 2.727  0.350 - 4.500 uIU/mL  POCT CBC      Result Value Ref Range   WBC 6.8  4.6 - 10.2 K/uL   Lymph, poc 2.6  0.6 - 3.4   POC LYMPH PERCENT 38.9  10 - 50 %L   MID (cbc) 0.5  0 - 0.9   POC MID % 7.1  0 - 12 %M   POC Granulocyte 3.7  2 - 6.9   Granulocyte percent 54.0  37 - 80 %G   RBC 5.16  4.04 - 5.48 M/uL   Hemoglobin 14.2  12.2 - 16.2 g/dL   HCT, POC 30.8  65.7 - 47.9 %   MCV 83.7  80 - 97 fL   MCH, POC 27.5  27 - 31.2 pg   MCHC 32.8  31.8 - 35.4 g/dL   RDW, POC 84.6     Platelet Count, POC 257  142 - 424 K/uL   MPV 7.7  0 - 99.8 fL     EKG/XRAY:   Primary read interpreted by Dr. Conley Rolls at [facility].  ASSESSMENT/PLAN: Encounter Diagnoses  Name Primary?  . Insomnia Yes  . Stress   . Screening for hyperlipidemia   . Abnormal liver enzymes    48 y/o Hispanic  female who is here with her husband who is translating. She is having work realted stress and life stress that maybe impacting her sleep. She denies anxiety of depression. I will dc her mabien and see how she does on klonazepam since it may help with her stress and also insomnia. Again reemphasized good sleep hygiene. I also suspect she has hyperlipidemia and needs to be on meds, will repeat labs for that as well as abnormal liver enzymes on last visit.  Rx clonazepam Labs pending:CMP , lipids F/u in 2 months   Gross sideeffects, risk and benefits, and alternatives of medications d/w patient. Patient is aware that all medications have potential sideeffects and we are unable to predict every sideeffect or drug-drug interaction that may occur.  ,  PHUONG, DO 07/10/2014 10:12 AM

## 2014-07-10 NOTE — Patient Instructions (Signed)
Clonazepam tablets Qu es este medicamento? El CLONAZEPAM es una benzodiacepina. Se utiliza para el tratamiento de ciertos tipos de convulsiones. Tambin se [place] para el tratamiento de trastorno de pnico. MarshallEste medicamento puede ser utilizado para otros usos; si tiene alguna pregunta consulte con su proveedor de atencin mdica o con su farmacutico. MARCAS COMERCIALES DISPONIBLES: Therapist, nutritionalCeberclon, Klonopin Qu le debo informar a mi profesional de la salud antes de tomar este medicamento? Necesita saber si usted presenta alguno de los [facility] Health Caresiguientes problemas o situaciones: -trastorno bipolar, depresin, psicosis u otros problemas de salud mental -glaucoma -enfermedad renal o heptica -enfermedad pulmonar o respiratoria -miastenia gravis -enfermedad de Parkinson -convulsiones o antecedentes de convulsiones -ideas suicidas -una reaccin alrgica o inusual al clonazepam, otras benzodiacepinas, alimentos, colorantes o conservadores -si est embarazada o buscando quedar embarazada -si est amamantando a un beb Cmo debo utilizar este medicamento? Tome este medicamento por va oral con un vaso de agua. Siga las instrucciones de la etiqueta del Tyheemedicamento. Si el Social workermedicamento le produce malestar estomacal, tmelo con alimentos o con [place]. Tome sus dosis a intervalos regulares. No tome su medicamento con una frecuencia mayor a la indicada. No deje de tomarlo ni cambie la dosis excepto si as lo indica su mdico o su profesional de Radiographer, therapeuticla salud. Su farmacutico le dar una [place] del medicamento especial con cada receta y relleno. Asegrese de leer esta informacin cada vez cuidadosamente. Hable con su pediatra para informarse acerca del uso de este medicamento en nios. Puede requerir atencin especial. Sobredosis: Pngase en contacto inmediatamente con un centro toxicolgico o una sala de urgencia si usted cree que haya tomado demasiado medicamento. ATENCIN: Reynolds AmericanEste medicamento es solo para usted. No comparta este  medicamento con nadie. Qu sucede si me olvido de una dosis? Si olvida una dosis, tmela lo antes posible. Si es casi la hora de la prxima dosis, tome slo esa dosis. No tome dosis adicionales o dobles. Qu puede interactuar con este medicamento? -suplementos dietticos o a base de hierbas -medicamentos para la depresin, ansiedad o trastornos psiquitricos -medicamentos para infecciones micticas, tales como fluconazol, itraconazol, quetoconazol o voriconazol -medicamentos para el tratamiento de las infecciones por VIH o SIDA -medicamentos para conciliar el sueo -analgsicos recetados -propantelina -rifampicina -sevelamer -algunos medicamentos para las convulsiones, tales como carbamazepina, fenobarbital, fenitona o primidona Puede ser que esta lista no menciona todas las posibles interacciones. Informe a su profesional de Beazer Homesla salud de [employer] Inctodos los productos a base de hierbas, medicamentos de Toptonventa libre o suplementos nutritivos que est tomando. Si usted fuma, consume bebidas alcohlicas o si utiliza drogas ilegales, indqueselo tambin a su profesional de Beazer Homesla salud. Algunas sustancias pueden interactuar con su medicamento. A qu debo estar atento al usar PPL Corporationeste medicamento? Visite a su mdico o a su profesional de la salud para chequear su evolucin peridicamente. Su cuerpo puede hacerse dependiente del medicamento. Si ha venido tomando Qwest Communicationseste medicamento en forma regular durante algn tiempo, no deje de tomarlo repentinamente. Debe reducir gradualmente la dosis para no sufrir efectos secundarios severos. Consulte a su mdico o a su profesional de la salud antes de aumentar o reducir la dosis. Aun despus de dejar de tomarlo, los efectos del medicamento en su cuerpo pueden perdurar Caremark Rxdurante varios das. Si sufre de distintos tipos de convulsiones, este medicamento puede aumentar las probabilidades de sufrir crisis de gran mal (epilepsia). Infrmeselo a su mdico o a su profesional de Beazer Homesla salud, tal  vez le recete un medicamento adicional. Puede experimentar somnolencia o mareos. No conduzca ni utilice  maquinaria, ni haga nada que Scientist, research (life sciences) en estado de alerta hasta que sepa cmo le afecta este medicamento. Para reducir el riesgo de mareos o Joanna, no se ponga de pie ni se siente con rapidez, especialmente si es un paciente de edad avanzada. El alcohol puede aumentar su somnolencia y [place]. Evite consumir bebidas alcohlicas. No se trate usted mismo si tiene tos, resfro o Environmental consultant sin Science writer a su mdico o a su profesional de Radiographer, therapeutic. Algunos ingredientes pueden aumentar los posibles efectos secundarios. El uso de este medicamento puede aumentar la posibilidad de Wilburt Finlay ideas o comportamiento suicida. Presta atencin a como usted responde al medicamento mientras est usndolo. Informe a su profesional de la salud inmediatamente de cualquier empeoramiento de humor o ideas de suicidio o de morir. Las mujeres que se encuentran Database administrator usan este medicamento pueden inscribirse en el registro del [employer] American Antiepileptic Drug Pregnancy Registry (Registro estadounidense de Psychiatrist de Medicamentos Antiepilpticos) llamando al telfono [phone]. Este registro recoge informacin acerca de la seguridad del uso de medicamentos antiepilpticos durante el Psychiatrist. Qu efectos secundarios puedo tener al [employer] este medicamento? Efectos secundarios que debe informar a su mdico o a Producer, television/film/video de la salud tan pronto como sea posible: -Therapist, art como erupcin cutnea, picazn o urticarias, hinchazn de la cara, labios o lengua -cambios en la visin -confusin -depresin -alucinaciones -cambios de humor, excitabilidad o comportamiento agresivo -dificultades con los movimientos, sensacin de vrtigo o movimientos entrecortados -calambres musculares o debilidad -temblores -movimientos anormales de los ojos Efectos secundarios que, por lo general, no  requieren atencin mdica (debe informarlos a su mdico o a su profesional de la salud si persisten o si son molestos): -estreimiento o diarrea -dificultad para conciliar el sueo, pesadillas -mareos, somnolencia, -dolor de cabeza -mayor salivacin de la boca -nuseas, vmito Puede ser que [employer] no menciona todos los posibles efectos secundarios. Comunquese a su mdico por asesoramiento mdico [employer]lett[employer]ard. Usted puede informar los efectos secundarios a la FDA por telfono al 1-800-FDA-1088. Dnde debo guardar mi medicina? Mantngala fuera del alcance de los nios. Este medicamento puede ser abusado. Mantenga su medicamento en un lugar seguro para protegerlo contra robos. No comparta este medicamento con nadie. Es peligroso vender o Restaurant manager, fast food y est prohibido por la ley. Gurdela a [employer], entre 15 y 30 grados C (44 y 13 grados F). Protjala de la luz. Mantenga el envase bien cerrado. Deseche todo el medicamento que no haya utilizado, despus de la fecha de vencimiento. ATENCIN: Este folleto es un resumen. Puede ser que no cubra toda la posible informacin. Si usted tiene preguntas acerca de esta medicina, consulte con su mdico, su farmacutico o su profesional de Radiographer, therapeutic.  2015, Elsevier/Gold Standard. (2009-08-30 16:43:44) Insomnio (Insomnia) El insomnio es un trastorno frecuente en la capacidad para dormirse o para Personal assistant dormido. Puede ser un problema crnico o un trastorno del momento. En ambos casos es un problema frecuente. Puede tratarse de un problema del momento cuando se relaciona con alguna situacin de estrs o preocupacin. Es un trastorno crnico cuando se relaciona con situaciones de Armed forces operational officer las horas de vigilia o con malos hbitos de sueo. Con el tiempo, la privacin del sueo en s misma, puede hacer que el problema empeore. Las cosas ms pequeas se agravan debido al cansancio y a que la capacidad para  enfrentarlas disminuye. CAUSAS  Estrs, ansiedad y depresin.  Malos hbitos para dormir.  Distracciones como mirar TV en la cama.  Siestas en horarios prximos a la hora de ir a dormir.  Involucrarse en conversaciones de gran carga emocional antes de ir a dormir.  Leer textos tcnicos antes de dormir.  Consumir alcohol y otros sedantes. Ellos pueden empeorar el problema. Pueden modificar los patrones de sueo normales y la normal [place].  Consumir estimulantes como cafena algunas horas antes de ir a dormir.  Sndromes dolorosos y las dificultades respiratorias pueden causar insomnio.  Realizar ejercicios a ltima hora de la noche.  El cambio en las zonas horarias puede causar trastornos del sueo (jet lag). En algunos casos se recomienda que otra persona observe sus patrones de sueo. Deben observar los perodos en los que no respira durante la noche (apnea del sueo). Tambin deben observar cunto tiempo duran esos perodos. Si vive slo o no tiene una persona de confianza que lo observe, podr concurrir a una clnica del sueo, en la que lo controlarn de [place] profesional. La apnea del sueo requiere controles y TEFL teacher. Entregue su historia clnica al profesional que lo asiste Tambin infrmele las observaciones que sus familiares hayan hecho con respecto a sus hbitos de sueo.  SNTOMAS  Sentir por la maana que no ha descansado lo suficiente.  Ansiedad y agitacin a la hora de dormir  Dificultad para dormirse o para Arts administrator sueo. TRATAMIENTO  El [employer] indicar un tratamiento para los trastornos subyacentes. Tambin podr aconsejarlo o ayudarlo si usted toma alcohol o se automedica con otras drogas. El tratamiento de los problemas subyacentes generalmente eliminarn los problemas de insomnio.  Podrn prescribirle medicamentos para usar durante un plazo breve. Generalmente no se recomiendan para un uso prolongado.  Generalmente no se  recomiendan los medicamentos de venta libre para un uso prolongado. Podran causarle adiccin.  Puede hacer ms fcil el conciliar el sueo si realiza modificaciones en su estilo de vida tales como:  Utilizar tcnicas de relajacin que favorecen la respiracin y reducen la tensin muscular.  No practicar actividad fsica en las ltimas horas del da.  Modificar la dieta y el horario de la ltima comida. No tomar colaciones durante la noche  Trate de establecer una hora habitual para irse a dormir.  La psicoterapia puede ser de utilidad para los problemas que le ocasionan estrs y preocupaciones.  Si hay un ambiente ruidoso y no Advertising account executive, puede ser til Optometrist suave o sonidos agradables.  Suspenda los trabajos tediosos y [employer] al menos una hora antes de ir a dormir. INSTRUCCIONES PARA EL CUIDADO DOMICILIARIO  Lleve un diario. Infrmele al profesional que lo asiste sus progresos. Esto incluye todos los efectos secundarios de los medicamentos. Concurra regularmente a la Psychiatrist con el profesional [place] nota de:  La hora en que se duerme.  Las horas en las que permanece despierto durante la noche.  La calidad del sueo.  Cmo se siente al da siguiente. Esta informacin ayudar a que Biochemist, clinical.   Levntese de la cama si permanece despierto por ms de 15 minutos. Lea o realice alguna actividad tranquila. Mantenga las luces bajas. Espere hasta que sienta sueo y luego vuelva a la cama.  Mantenga un ritmo constante de vigilia y sueo. Evite las siestas.  Practique actividad fsica con regularidad.  Evite las distracciones en el momento de ir a dormir. Entre las distracciones se Production assistant, radio ver televisin o Education officer, environmental alguna actividad intensa o [place], hacer las cuentas de los gastos domsticos.  Programe un ritual para irse a dormir. Mantenga una rutina familiar relacionada  con el bao diario, el cepillado de los [address] Streetdientes, irse a la cama todas  las noches a la misma hora, Tax adviserescuchar Viburnummsica suave. La rutina aumenta el xito de conciliar el sueo ms rpido.  Use tcnicas de relajacin. Practique rutinas que favorezcan la respiracin y alivien la tensin muscular. Tambin puede ayudarlo la visualizacin de escenas pacficas. Tambin puede tratar de AGCO Corporationcontrolar los pensamientos problemticos o molestos si mantiene la mente ocupada con pensamientos repetitivos o aburridos, como el antiguo consejo de Multimedia programmercontar ovejas. Tambin puede ser ms creativo, e imaginar que planta hermosas flores en su jardn, una detrs de la otra.  Durante el da, trabaje para Chief Strategy Officereliminar el estrs. Cuando no es posible, algunas de las sugerencias ya presentadas lo ayudarn a reducir la ansiedad que acompaa las situaciones de estrs. EST SEGURO QUE:   Comprende las instrucciones para el alta mdica.  Controlar su enfermedad.  Solicitar atencin mdica de inmediato segn las indicaciones. Document Released: 09/01/2005 Document Revised: 11/24/2011 [facility] Patient Information 2015 [place]villeExitCare, MarylandLLC. This information is not intended to replace advice given to you by your health care provider. Make sure you discuss any questions you have with your health care provider.

## 2014-07-13 ENCOUNTER — Other Ambulatory Visit: Payer: Self-pay | Admitting: Family Medicine

## 2014-07-13 MED ORDER — SIMVASTATIN 40 MG PO TABS
40.0000 mg | ORAL_TABLET | Freq: Every day | ORAL | Status: DC
Start: 2014-07-13 — End: 2018-05-17

## 2014-12-12 ENCOUNTER — Encounter (HOSPITAL_COMMUNITY): Payer: Self-pay | Admitting: Emergency Medicine

## 2014-12-12 ENCOUNTER — Emergency Department (HOSPITAL_COMMUNITY)
Admission: EM | Admit: 2014-12-12 | Discharge: 2014-12-13 | Disposition: A | Discharge status: Home / Self Care | Payer: Worker's Compensation | Attending: Emergency Medicine | Admitting: Emergency Medicine

## 2014-12-12 DIAGNOSIS — M545 Low back pain, unspecified: Secondary | ICD-10-CM

## 2014-12-12 DIAGNOSIS — Y990 Civilian activity done for income or pay: Secondary | ICD-10-CM | POA: Diagnosis not present

## 2014-12-12 DIAGNOSIS — Y929 Unspecified place or not applicable: Secondary | ICD-10-CM | POA: Insufficient documentation

## 2014-12-12 DIAGNOSIS — Y9389 Activity, other specified: Secondary | ICD-10-CM | POA: Diagnosis not present

## 2014-12-12 DIAGNOSIS — M549 Dorsalgia, unspecified: Secondary | ICD-10-CM

## 2014-12-12 DIAGNOSIS — I10 Essential (primary) hypertension: Secondary | ICD-10-CM | POA: Diagnosis not present

## 2014-12-12 DIAGNOSIS — Z79899 Other long term (current) drug therapy: Secondary | ICD-10-CM | POA: Diagnosis not present

## 2014-12-12 DIAGNOSIS — S24109A Unspecified injury at unspecified level of thoracic spinal cord, initial encounter: Secondary | ICD-10-CM | POA: Insufficient documentation

## 2014-12-12 DIAGNOSIS — X58XXXA Exposure to other specified factors, initial encounter: Secondary | ICD-10-CM | POA: Insufficient documentation

## 2014-12-12 DIAGNOSIS — S3992XA Unspecified injury of lower back, initial encounter: Secondary | ICD-10-CM | POA: Diagnosis not present

## 2014-12-12 DIAGNOSIS — E780 Pure hypercholesterolemia: Secondary | ICD-10-CM | POA: Diagnosis not present

## 2014-12-12 MED ORDER — HYDROMORPHONE HCL 1 MG/ML IJ SOLN
2.0000 mg | Freq: Once | INTRAMUSCULAR | Status: AC
Start: 2014-12-12 — End: 2014-12-13
  Administered 2014-12-13: 2 mg via INTRAMUSCULAR
  Filled 2014-12-12: qty 2

## 2014-12-12 MED ORDER — ONDANSETRON 4 MG PO TBDP
8.0000 mg | ORAL_TABLET | Freq: Once | ORAL | Status: AC
  Administered 2014-12-13: 8 mg via ORAL
  Filled 2014-12-12: qty 2

## 2014-12-12 NOTE — ED Provider Notes (Signed)
CSN: [id number]     Arrival date & time 12/12/14  2119 History  This chart was scribed for Harle Battiest, NP, working with Doug Sou, MD by Elon Spanner, ED Scribe. This patient was seen in room TR10C/TR10C and the patient's care was started at 11:23 PM.   Chief Complaint  Patient presents with  . Back Pain   The history is provided by the patient. No language interpreter was used.   HPI Comments: Cathy Duran is a 49 y.o. female who presents to the Emergency Department complaining of 10/10 lower back pain with some mild radiation to her right groin onset 8:30 this evening.  The patient reports she was at work and bent over to pick something up and when she quickly lifted back up, she heard a pop and her current complaint onset.  Patient denies saddle paresthesia, bowel/bladder incontinence, leg numbness/weakness, fever.  LNMP 3 months ago but patient has been told by her OB/Gyn she is beginning menopause.  She rates her pain 10/10.   Past Medical History  Diagnosis Date  . Hypertension   . High cholesterol    Past Surgical History  Procedure Laterality Date  . Tubal ligation     Family History  Problem Relation Age of Onset  . Diabetes Mother   . Diabetes Father   . Hypertension Sister    History  Substance Use Topics  . Smoking status: Never Smoker   . Smokeless tobacco: Never Used  . Alcohol Use: No   OB History    No data available     Review of Systems  Constitutional: Negative for fever.  Musculoskeletal: Positive for back pain.  Neurological: Negative for weakness and numbness.      Allergies  Review of patient's allergies indicates no known allergies.  Home Medications   Prior to Admission medications   Medication Sig Start Date End Date Taking? Authorizing Provider  clonazePAM (KLONOPIN) 0.5 MG tablet 1/2-1 tab po qhs 07/10/14   Thao P Le, DO  simvastatin (ZOCOR) 40 MG tablet Take 1 tablet (40 mg total) by mouth at bedtime. 07/13/14   Thao  P Le, DO  zolpidem (AMBIEN) 5 MG tablet Take 1 tablet (5 mg total) by mouth at bedtime as needed for sleep. 06/19/14   Thao P Le, DO   BP 132/85 mmHg  Pulse 88  Temp(Src) 98.2 F (36.8 C) (Oral)  Resp 18  SpO2 98% Physical Exam  Constitutional: She is oriented to person, place, and time. She appears well-developed and well-nourished. No distress.  HENT:  Head: Normocephalic and atraumatic.  Eyes: Conjunctivae and EOM are normal.  Neck: Neck supple. No tracheal deviation present.  Cardiovascular: Normal rate.   Pulmonary/Chest: Effort normal. No respiratory distress.  Musculoskeletal: Normal range of motion. She exhibits tenderness.       Thoracic back: She exhibits bony tenderness.       Back:  TTP over distal thoracic spine.  Neurological: She is alert and oriented to person, place, and time. She has normal strength. No cranial nerve deficit or sensory deficit. Coordination normal. GCS eye subscore is 4. GCS verbal subscore is 5. GCS motor subscore is 6.  Cranial nerves 2-12 intact  Skin: Skin is warm and dry.  Psychiatric: She has a normal mood and affect. Her behavior is normal.  Nursing note and vitals reviewed.   ED Course  Procedures (including critical care time)  DIAGNOSTIC STUDIES: Oxygen Saturation is 98% on RA, normal by my interpretation.    COORDINATION  OF CARE:  11:28 PM Discussed treatment plan with patient at bedside.  Patient acknowledges and agrees with plan.    Labs Review Labs Reviewed - No data to display  Imaging Review Dg Thoracic Spine W/swimmers  12/13/2014   CLINICAL DATA:  Acute onset of mid back pain, after hearing pop while lifting object. Initial encounter.  EXAM: THORACIC SPINE - 2 VIEW + SWIMMERS  COMPARISON:  None.  FINDINGS: There is no evidence of fracture or subluxation. Vertebral bodies demonstrate normal height and alignment. Intervertebral disc spaces are preserved. Anterior osteophytes are seen at T11-T12.  The visualized portions of  both lungs are clear. The mediastinum is unremarkable in appearance.  IMPRESSION: No evidence of fracture or subluxation along the thoracic spine.   Electronically Signed   By: Roanna RaiderJeffery  Chang M.D.   On: 12/13/2014 00:33   Dg Lumbar Spine Complete  12/13/2014   CLINICAL DATA:  Acute onset of lower back pain, radiating to the right groin, after picking up an object. Heard pop. Initial encounter.  EXAM: LUMBAR SPINE - COMPLETE 4+ VIEW  COMPARISON:  None.  FINDINGS: There is no evidence of fracture or subluxation. Vertebral bodies demonstrate normal height and alignment. Intervertebral disc spaces are preserved. The visualized neural foramina are grossly unremarkable in appearance. Anterior osteophytes are noted at the lower lumbar spine, and also at T11-T12.  The visualized bowel gas pattern is unremarkable in appearance; air and stool are noted within the colon. The sacroiliac joints are within normal limits.  IMPRESSION: No evidence of fracture or subluxation along the lumbar spine.   Electronically Signed   By: Roanna RaiderJeffery  Chang M.D.   On: 12/13/2014 00:32     EKG Interpretation None      MDM   Final diagnoses:  Midline low back pain without sciatica   49 yo with back pain onset tonight. She has no red flag symptoms including no neurological deficits, no loss of bowel or bladder control, no fever, night sweats, weight loss, h/o cancer, or IVDU. X-ray negative for acute fracture. Pain managed in the ED. Conservative mgmt and pain medicine indicated and discussed with patient. Referral for ortho provided in case pain does not improve. Pt is well-appearing, in no acute distress and vital signs reviewed and not concerning.  She appears safe to be discharged.  Return precautions provided. Pt aware of plan and in agreement.     I personally performed the services described in this documentation, which was scribed in my presence. The recorded information has been reviewed and is accurate.  Filed Vitals:    12/12/14 2126 12/13/14 0125  BP: 132/85 110/70  Pulse: 88 93  Temp: 98.2 F (36.8 C)   TempSrc: Oral   Resp: 18 22  SpO2: 98% 99%   Meds given in ED:  Medications  HYDROmorphone (DILAUDID) injection 2 mg (2 mg Intramuscular Given 12/13/14 0041)  ondansetron (ZOFRAN-ODT) disintegrating tablet 8 mg (8 mg Oral Given 12/13/14 0041)    Discharge Medication List as of 12/13/2014  1:26 AM    START taking these medications   Details  cyclobenzaprine (FLEXERIL) 10 MG tablet Take 1 tablet (10 mg total) by mouth 2 (two) times daily as needed for muscle spasms., Starting 12/13/2014, Until Discontinued, Print    HYDROcodone-acetaminophen (NORCO/VICODIN) 5-325 MG per tablet Take 1 tablet by mouth every 4 (four) hours as needed., Starting 12/13/2014, Until Discontinued, Print    naproxen (NAPROSYN) 500 MG tablet Take 1 tablet (500 mg total) by mouth 2 (two) times  daily., Starting 12/13/2014, Until Discontinued, Print          Harle Battiest, NP 12/14/14 1155  Derwood Kaplan, MD 12/15/14 0000

## 2014-12-12 NOTE — ED Notes (Signed)
Pt presents with lower back pain after bending forward to lift something at work just prior to arrival- pt denies numbness or tingling to extremities, denies bowel or bladder incontinence.

## 2014-12-13 ENCOUNTER — Emergency Department (HOSPITAL_COMMUNITY): Payer: Worker's Compensation

## 2014-12-13 MED ORDER — HYDROCODONE-ACETAMINOPHEN 5-325 MG PO TABS: 1.0000 | ORAL_TABLET | ORAL | Status: DC | PRN

## 2014-12-13 MED ORDER — CYCLOBENZAPRINE HCL 10 MG PO TABS: 10.0000 mg | ORAL_TABLET | Freq: Two times a day (BID) | ORAL | Status: DC | PRN

## 2014-12-13 MED ORDER — NAPROXEN 500 MG PO TABS: 500.0000 mg | ORAL_TABLET | Freq: Two times a day (BID) | ORAL | Status: DC

## 2014-12-13 NOTE — Discharge Instructions (Signed)
Please follow directions provided. Sure to follow-up with orthopedic doctor if your pain does not improve. Please take the naproxen twice a day to help with pain and inflammation. You may use the muscle relaxer, Flexeril to help with muscle spasms. May use the Vicodin to help with pain not relieved by the naproxen and Flexeril. Please use heating pad to help with pain also. Don't hesitate to return for any new, worsening, or concerning symptoms.   SEEK IMMEDIATE MEDICAL CARE IF:  You have pain that radiates from your back into your legs.  You develop new bowel or bladder control problems.  You have unusual weakness or numbness in your arms or legs.  You develop nausea or vomiting.  You develop abdominal pain.  You feel faint.

## 2016-01-31 ENCOUNTER — Ambulatory Visit (INDEPENDENT_AMBULATORY_CARE_PROVIDER_SITE_OTHER): Payer: PRIVATE HEALTH INSURANCE

## 2016-01-31 ENCOUNTER — Ambulatory Visit (INDEPENDENT_AMBULATORY_CARE_PROVIDER_SITE_OTHER): Payer: PRIVATE HEALTH INSURANCE | Admitting: Physician Assistant

## 2016-01-31 ENCOUNTER — Ambulatory Visit: Payer: PRIVATE HEALTH INSURANCE

## 2016-01-31 VITALS — BP 110/66 | HR 92 | Temp 98.4°F | Resp 16 | Ht 60.35 in | Wt 181.0 lb

## 2016-01-31 DIAGNOSIS — M25561 Pain in right knee: Secondary | ICD-10-CM

## 2016-01-31 MED ORDER — MELOXICAM 15 MG PO TABS
7.5000 mg | ORAL_TABLET | Freq: Every day | ORAL | Status: DC
Start: 2016-01-31 — End: 2018-05-17

## 2016-01-31 NOTE — Patient Instructions (Signed)
     IF you received an x-ray today, you will receive an invoice from [facility]. Please contact [facility] at [phone] with questions or concerns regarding your invoice.   IF you received labwork today, you will receive an invoice from [facility]. Please contact [facility] at [phone] with questions or concerns regarding your invoice.   Our billing staff will not be able to assist you with questions regarding bills from these companies.  You will be contacted with the lab results as soon as they are available. The fastest way to get your results is to activate your My Chart account. Instructions are located on the last page of this paperwork. If you have not heard from us regarding the results in 2 weeks, please contact this office.      

## 2016-01-31 NOTE — Progress Notes (Signed)
01/31/2016 5:19 PM   DOB: 04-Feb-1966 / MRN: [id number]  SUBJECTIVE:  Cathy Duran is a 50 y.o. female presenting for right sided knee pain times two months. Complains that the pain is medial and femoral, worse with physical activity.  Denies any trauma to the joint.  Feels she is getting worse.  Has not tried any medication.  Denies weight loss.    She has No Known Allergies.   She  has a past medical history of Hypertension and High cholesterol.    She  reports that she has never smoked. She has never used smokeless tobacco. She reports that she does not drink alcohol or use illicit drugs. She  reports that she currently engages in sexual activity. She reports using the following method of birth control/protection: Surgical. The patient  has past surgical history that includes Tubal ligation.  Her family history includes Diabetes in her father and mother; Hypertension in her sister.  Review of Systems  Constitutional: Negative for fever and chills.  Musculoskeletal: Positive for joint pain. Negative for myalgias, back pain, falls and neck pain.  Skin: Negative for rash.  Psychiatric/Behavioral: Negative for depression.    Problem list and medications reviewed and updated by myself where necessary, and exist elsewhere in the encounter.   OBJECTIVE:  BP 110/66 mmHg  Pulse 92  Temp(Src) 98.4 F (36.9 C) (Oral)  Resp 16  Ht 5' 0.35" (1.533 m)  Wt 181 lb (82.101 kg)  BMI 34.94 kg/m2  SpO2 98%  Physical Exam  Constitutional: She is oriented to person, place, and time. She appears well-nourished. No distress.  Eyes: EOM are normal. Pupils are equal, round, and reactive to light.  Cardiovascular: Normal rate, regular rhythm and normal heart sounds.   Pulmonary/Chest: Effort normal.  Abdominal: She exhibits no distension.  Musculoskeletal:       Right knee: She exhibits decreased range of motion. She exhibits no swelling, no deformity, no erythema, normal alignment, no LCL  laxity, normal patellar mobility, no bony tenderness, normal meniscus and no MCL laxity. No medial joint line, no lateral joint line, no MCL, no LCL and no patellar tendon tenderness noted.       Left knee: Normal.       Legs: Neurological: She is alert and oriented to person, place, and time. No cranial nerve deficit. Gait normal.  Skin: Skin is dry. She is not diaphoretic.  Psychiatric: She has a normal mood and affect.  Vitals reviewed.   No results found for this or any previous visit (from the past 72 hour(s)).  Dg Knee Ap/lat W/sunrise Right  01/31/2016  CLINICAL DATA:  Knee pain for 2 months, no injury EXAM: RIGHT KNEE 3 VIEWS COMPARISON:  None. FINDINGS: The joint spaces appear normal with no evidence of degenerative joint disease. No fracture is seen. No joint effusion is noted. The patella is normally positioned. IMPRESSION: Negative. Electronically Signed   By: Dwyane Dee M.D.   On: 01/31/2016 17:15    ASSESSMENT AND PLAN  Cathy Duran was seen today for knee pain.  Diagnoses and all orders for this visit:  Right knee pain: This is likely muscular.  Advised Meloxicam, ACE wrap and ICE when possible. She is to return in 7 days if she has no relief with this plain.   -     Cancel: DG Knee Complete 4 Views Right; Future -     DG Knee AP/LAT W/Sunrise Right; Future    The patient was advised to call or return  to clinic if she does not see an improvement in symptoms or to seek the care of the closest emergency department if she worsens with the above plan.   Cathy BostonMichael Duran, MHS, PA-C [facility] 01/31/2016 5:19 PM

## 2017-03-23 ENCOUNTER — Ambulatory Visit: Payer: Self-pay

## 2017-03-23 ENCOUNTER — Other Ambulatory Visit: Payer: Self-pay | Admitting: Occupational Medicine

## 2017-03-23 DIAGNOSIS — M25571 Pain in right ankle and joints of right foot: Secondary | ICD-10-CM

## 2018-05-16 ENCOUNTER — Emergency Department (HOSPITAL_COMMUNITY)
Admission: EM | Admit: 2018-05-16 | Discharge: 2018-05-17 | Disposition: A | Discharge status: Home / Self Care | Payer: No Typology Code available for payment source | Attending: Emergency Medicine | Admitting: Emergency Medicine

## 2018-05-16 ENCOUNTER — Encounter (HOSPITAL_COMMUNITY): Payer: Self-pay | Admitting: Emergency Medicine

## 2018-05-16 DIAGNOSIS — I10 Essential (primary) hypertension: Secondary | ICD-10-CM | POA: Diagnosis not present

## 2018-05-16 DIAGNOSIS — M545 Low back pain, unspecified: Secondary | ICD-10-CM

## 2018-05-16 NOTE — ED Triage Notes (Signed)
Pt reports falling at work last week, presents with lower back pain ever since. Pt not initially seen, wants to be checked out now.

## 2018-05-17 ENCOUNTER — Emergency Department (HOSPITAL_COMMUNITY): Payer: No Typology Code available for payment source

## 2018-05-17 MED ORDER — METHOCARBAMOL 500 MG PO TABS: 500.0000 mg | ORAL_TABLET | Freq: Two times a day (BID) | ORAL | 0 refills | Status: DC

## 2018-05-17 MED ORDER — IBUPROFEN 800 MG PO TABS
800.0000 mg | ORAL_TABLET | Freq: Once | ORAL | Status: AC
  Administered 2018-05-17: 800 mg via ORAL
  Filled 2018-05-17: qty 1

## 2018-05-17 MED ORDER — DICLOFENAC SODIUM 75 MG PO TBEC: 75.0000 mg | DELAYED_RELEASE_TABLET | Freq: Two times a day (BID) | ORAL | 0 refills | Status: DC

## 2018-05-17 NOTE — ED Provider Notes (Signed)
[facility] EMERGENCY DEPARTMENT Provider Note   CSN: [id number] Arrival date & time: 05/16/18  2208     History   Chief Complaint Chief Complaint  Patient presents with  . Back Pain    HPI Cathy Duran is a 52 y.o. female.  The history is provided by the patient. No language interpreter was used.  Back Pain   This is a new problem. The problem occurs constantly. The problem has not changed since onset.The pain is associated with a recent injury. The pain is present in the sacro-iliac joint. The quality of the pain is described as aching. The pain is moderate. Pertinent negatives include no pelvic pain. She has tried nothing for the symptoms. The treatment provided mild relief.  Pt reports she fell at work a week ago and injured her low back.   Pt reports it hurts to sit.  Pt points to tail bone as area of pain.   Past Medical History:  Diagnosis Date  . High cholesterol   . Hypertension     There are no active problems to display for this patient.   Past Surgical History:  Procedure Laterality Date  . TUBAL LIGATION       OB History   None      Home Medications    Prior to Admission medications   Medication Sig Start Date End Date Taking? Authorizing Provider  diclofenac (VOLTAREN) 75 MG EC tablet Take 1 tablet (75 mg total) by mouth 2 (two) times daily. 05/17/18   Elson Areas, PA-C  methocarbamol (ROBAXIN) 500 MG tablet Take 1 tablet (500 mg total) by mouth 2 (two) times daily. 05/17/18   Elson Areas, PA-C    Family History Family History  Problem Relation Age of Onset  . Diabetes Mother   . Diabetes Father   . Hypertension Sister     Social History Social History   Tobacco Use  . Smoking status: Never Smoker  . Smokeless tobacco: Never Used  Substance Use Topics  . Alcohol use: No  . Drug use: No     Allergies   Patient has no known allergies.   Review of Systems Review of Systems  Genitourinary: Negative for  pelvic pain.  Musculoskeletal: Positive for back pain.  All other systems reviewed and are negative.    Physical Exam Updated Vital Signs BP (!) 151/92   Pulse 78   Ht 5\' 5"  (1.651 m)   Wt 79.4 kg   SpO2 100%   BMI 29.12 kg/m   Physical Exam  Constitutional: She is oriented to person, place, and time. She appears well-developed and well-nourished.  HENT:  Head: Normocephalic.  Eyes: EOM are normal.  Neck: Normal range of motion.  Cardiovascular: Normal rate.  Pulmonary/Chest: Effort normal.  Abdominal: Soft. She exhibits no distension.  Musculoskeletal: Normal range of motion.  Tender coccyx area   Neurological: She is alert and oriented to person, place, and time.  Psychiatric: She has a normal mood and affect.  Nursing note and vitals reviewed.    ED Treatments / Results  Labs (all labs ordered are listed, but only abnormal results are displayed) Labs Reviewed - No data to display  EKG None  Radiology Dg Sacrum/coccyx  Result Date: 05/17/2018 CLINICAL DATA:  52 year old female with fall and sacral pain. EXAM: SACRUM AND COCCYX - 2+ VIEW COMPARISON:  Lumbar spine radiograph dated 12/13/2014 FINDINGS: There is no evidence of fracture or other focal bone lesions. IMPRESSION: Negative. Electronically Signed  By: Elgie Collard M.D.   On: 05/17/2018 00:52    Procedures Procedures (including critical care time)  Medications Ordered in ED Medications  ibuprofen (ADVIL,MOTRIN) tablet 800 mg (800 mg Oral Given 05/17/18 0124)     Initial Impression / Assessment and Plan / ED Course  I have reviewed the triage vital signs and the nursing notes.  Pertinent labs & imaging results that were available during my care of the patient were reviewed by me and considered in my medical decision making (see chart for details).     MDM  Pt very tender over coccyx,  Bruise vs occult fracture.   Pt given rx for Robaxin and Voltaren.   Pt advised to see Orthopaedist if pain  persist past one week.   Final Clinical Impressions(s) / ED Diagnoses   Final diagnoses:  Acute low back pain without sciatica, unspecified back pain laterality    ED Discharge Orders         Ordered    methocarbamol (ROBAXIN) 500 MG tablet  2 times daily     05/17/18 0119    diclofenac (VOLTAREN) 75 MG EC tablet  2 times daily     05/17/18 0119        An After Visit Summary was printed and given to the patient.    Elson Areas, PA-C 05/17/18 0131    Zadie Rhine, MD 05/17/18 [phone]

## 2018-05-17 NOTE — ED Notes (Signed)
Pt discharged from ED; instructions provided and scripts given; Pt encouraged to return to ED if symptoms worsen and to f/u with PCP; Pt verbalized understanding of all instructions 

## 2018-06-28 ENCOUNTER — Other Ambulatory Visit: Payer: Self-pay

## 2018-06-28 ENCOUNTER — Emergency Department (HOSPITAL_COMMUNITY)
Admission: EM | Admit: 2018-06-28 | Discharge: 2018-06-28 | Disposition: A | Discharge status: Home / Self Care | Payer: No Typology Code available for payment source | Attending: Emergency Medicine | Admitting: Emergency Medicine

## 2018-06-28 ENCOUNTER — Encounter (HOSPITAL_COMMUNITY): Payer: Self-pay

## 2018-06-28 DIAGNOSIS — M545 Low back pain: Secondary | ICD-10-CM | POA: Diagnosis present

## 2018-06-28 DIAGNOSIS — I10 Essential (primary) hypertension: Secondary | ICD-10-CM | POA: Insufficient documentation

## 2018-06-28 DIAGNOSIS — Z79899 Other long term (current) drug therapy: Secondary | ICD-10-CM | POA: Diagnosis not present

## 2018-06-28 DIAGNOSIS — M533 Sacrococcygeal disorders, not elsewhere classified: Secondary | ICD-10-CM | POA: Insufficient documentation

## 2018-06-28 MED ORDER — IBUPROFEN 400 MG PO TABS
400.0000 mg | ORAL_TABLET | Freq: Once | ORAL | Status: AC
  Administered 2018-06-28: 400 mg via ORAL
  Filled 2018-06-28: qty 1

## 2018-06-28 MED ORDER — METHOCARBAMOL 750 MG PO TABS: 750.0000 mg | ORAL_TABLET | Freq: Three times a day (TID) | ORAL | 0 refills | Status: AC | PRN

## 2018-06-28 NOTE — ED Provider Notes (Signed)
[facility] EMERGENCY DEPARTMENT Provider Note   CSN: [id number] Arrival date & time: 06/28/18  0801     History   Chief Complaint Chief Complaint  Patient presents with  . Back Pain    HPI Camaryn Lumbert is a 52 y.o. female.  Patient c/o low back pain. Pt notes slip and fall 2 months ago at work, landing on buttocks. Had xrays then neg for fx. Pain improved, but now c/o lower lumbar/sacral area pain, dull, moderate, non radiating, worse w certain movement and position changes. No skin changes/lesions to area of pain. No swelling/redness. No radicular pain or leg pain. No saddle or leg numbness. No weakness. No dysuria, hematuria, retention, or gu c/o. No anterior pain - no abd or pelvic pain. Denies other pain or injury. No fever or chills.   The history is provided by the patient. A language interpreter was used (used Production designer, theatre/television/film. ).  Back Pain   Pertinent negatives include no fever, no abdominal pain, no dysuria, no pelvic pain and no weakness.    Past Medical History:  Diagnosis Date  . High cholesterol   . Hypertension     There are no active problems to display for this patient.   Past Surgical History:  Procedure Laterality Date  . TUBAL LIGATION       OB History   None      Home Medications    Prior to Admission medications   Medication Sig Start Date End Date Taking? Authorizing Provider  diclofenac (VOLTAREN) 75 MG EC tablet Take 1 tablet (75 mg total) by mouth 2 (two) times daily. 05/17/18   Elson Areas, PA-C  methocarbamol (ROBAXIN) 500 MG tablet Take 1 tablet (500 mg total) by mouth 2 (two) times daily. 05/17/18   Elson Areas, PA-C    Family History Family History  Problem Relation Age of Onset  . Diabetes Mother   . Diabetes Father   . Hypertension Sister     Social History Social History   Tobacco Use  . Smoking status: Never Smoker  . Smokeless tobacco: Never Used  Substance Use Topics  .  Alcohol use: No  . Drug use: No     Allergies   Patient has no known allergies.   Review of Systems Review of Systems  Constitutional: Negative for fever.  Gastrointestinal: Negative for abdominal pain.  Genitourinary: Negative for dysuria, hematuria and pelvic pain.  Musculoskeletal: Positive for back pain.  Skin: Negative for rash and wound.  Neurological: Negative for weakness.     Physical Exam Updated Vital Signs BP (!) 160/74 (BP Location: Right Arm)   Pulse 72   Temp 97.9 F (36.6 C) (Oral)   Resp 16   SpO2 100%   Physical Exam  Constitutional: She appears well-developed and well-nourished.  HENT:  Head: Atraumatic.  Eyes: Pupils are equal, round, and reactive to light. Conjunctivae are normal. No scleral icterus.  Neck: Normal range of motion. Neck supple. No tracheal deviation present.  Cardiovascular: Normal rate, regular rhythm, normal heart sounds and intact distal pulses.  Pulmonary/Chest: Effort normal and breath sounds normal. No respiratory distress.  Abdominal: Soft. Normal appearance and bowel sounds are normal. She exhibits no distension and no mass. There is no tenderness. There is no rebound and no guarding. No hernia.  Genitourinary:  Genitourinary Comments: No cva tenderness.   Musculoskeletal: She exhibits no edema.  CTLS spine, non tender, aligned, no step off. No skin changes, sts, or erythema to  area of pain.   Neurological: She is alert.  Speech clear/fluent. Motor intact bil legs, stre 5/5. sens grossly intact. Steady gait. Symmetric lower extremity reflexes.   Skin: Skin is warm and dry. No rash noted.  Psychiatric: She has a normal mood and affect.  Nursing note and vitals reviewed.    ED Treatments / Results  Labs (all labs ordered are listed, but only abnormal results are displayed) Labs Reviewed - No data to display  EKG None  Radiology No results found.  Procedures Procedures (including critical care  time)  Medications Ordered in ED Medications  ibuprofen (ADVIL,MOTRIN) tablet 400 mg (has no administration in time range)     Initial Impression / Assessment and Plan / ED Course  I have reviewed the triage vital signs and the nursing notes.  Pertinent labs & imaging results that were available during my care of the patient were reviewed by me and considered in my medical decision making (see chart for details).  Motrin po.  Reviewed nursing notes and prior charts for additional history. Reviewed prior xrays - neg for fx, no new trauma since.   Pt afebrile. Physical exam unremarkable.   Job does involve some bending/lifting - ?msk strain.   Confirmed nkda. rx for home.   rec pcp f/u.     Final Clinical Impressions(s) / ED Diagnoses   Final diagnoses:  None    ED Discharge Orders    None       Cathren Laine, MD 06/28/18 [phone]

## 2018-06-28 NOTE — ED Triage Notes (Signed)
Pt states she has lower back pain X2 months. Pt did have a fall at work. Pt ambulatory and moves all extremities.

## 2018-06-28 NOTE — Discharge Instructions (Addendum)
It was our pleasure to provide your ER care today - we hope that you feel better.  Take ibuprofen as need for pain (available over the counter).  You may take robaxin as need for muscle pain/spasm - no driving when taking.  Follow up with primary care doctor in 1-2 weeks if symptoms fail to improve/resolve.  Return to ER if worse, new symptoms, fevers, increased swelling/redness to area, numbness/weakness, other concern.

## 2019-01-18 IMAGING — CR DG ANKLE COMPLETE 3+V*R*
3 series · 3 of 3 positions shown · non-contrast
Comparison: None.

CLINICAL DATA: Trauma 1 week ago with lateral pain

EXAM:
RIGHT ANKLE - COMPLETE 3+ VIEW

[view not recorded (1 of 3)]
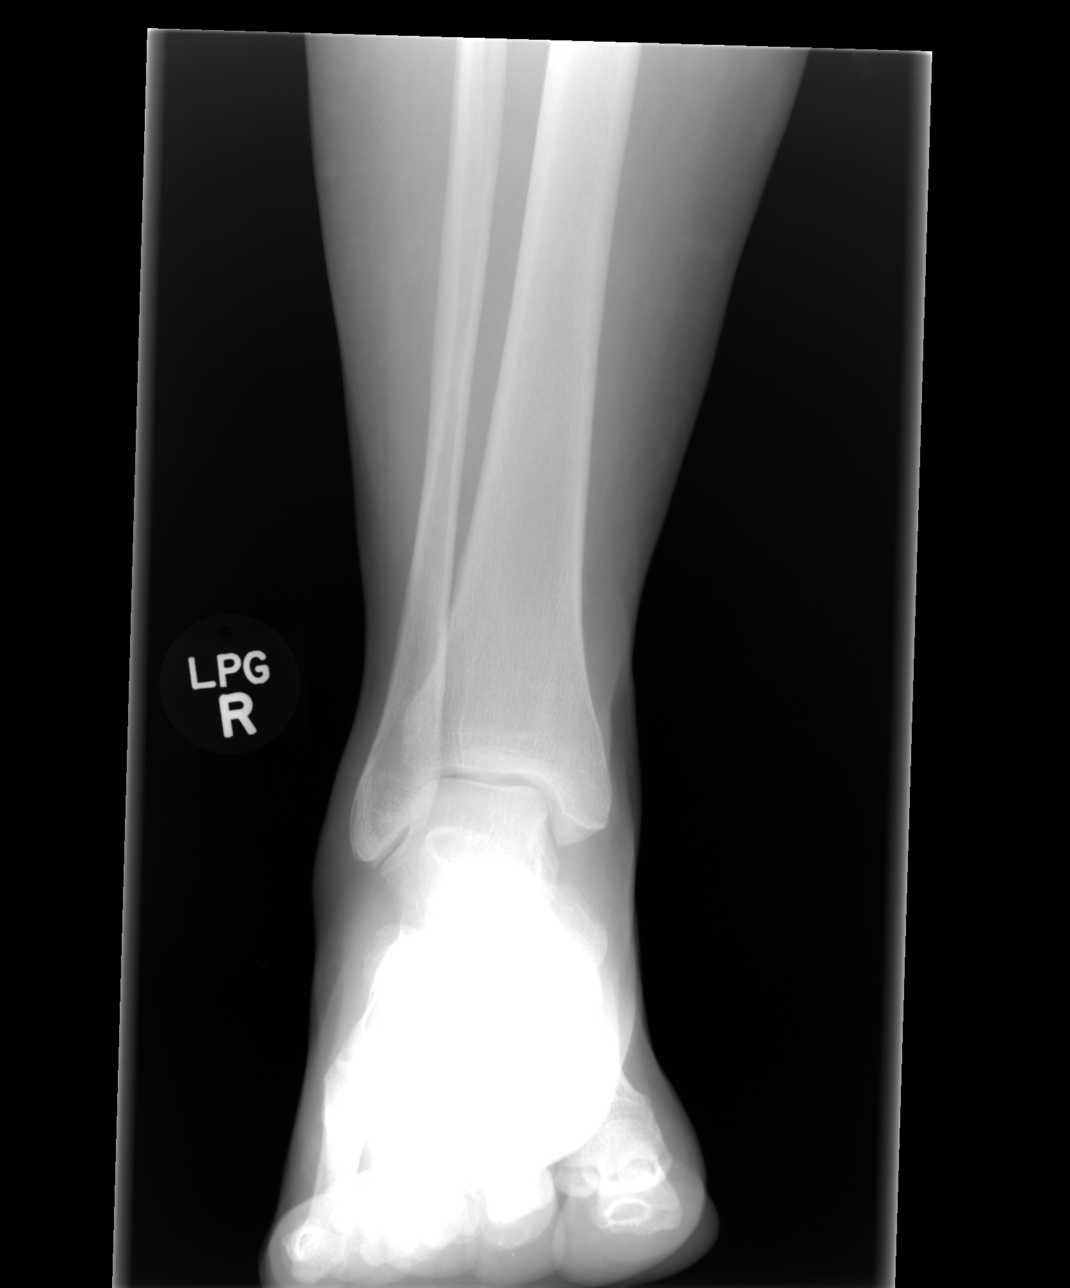

[view not recorded (2 of 3)]
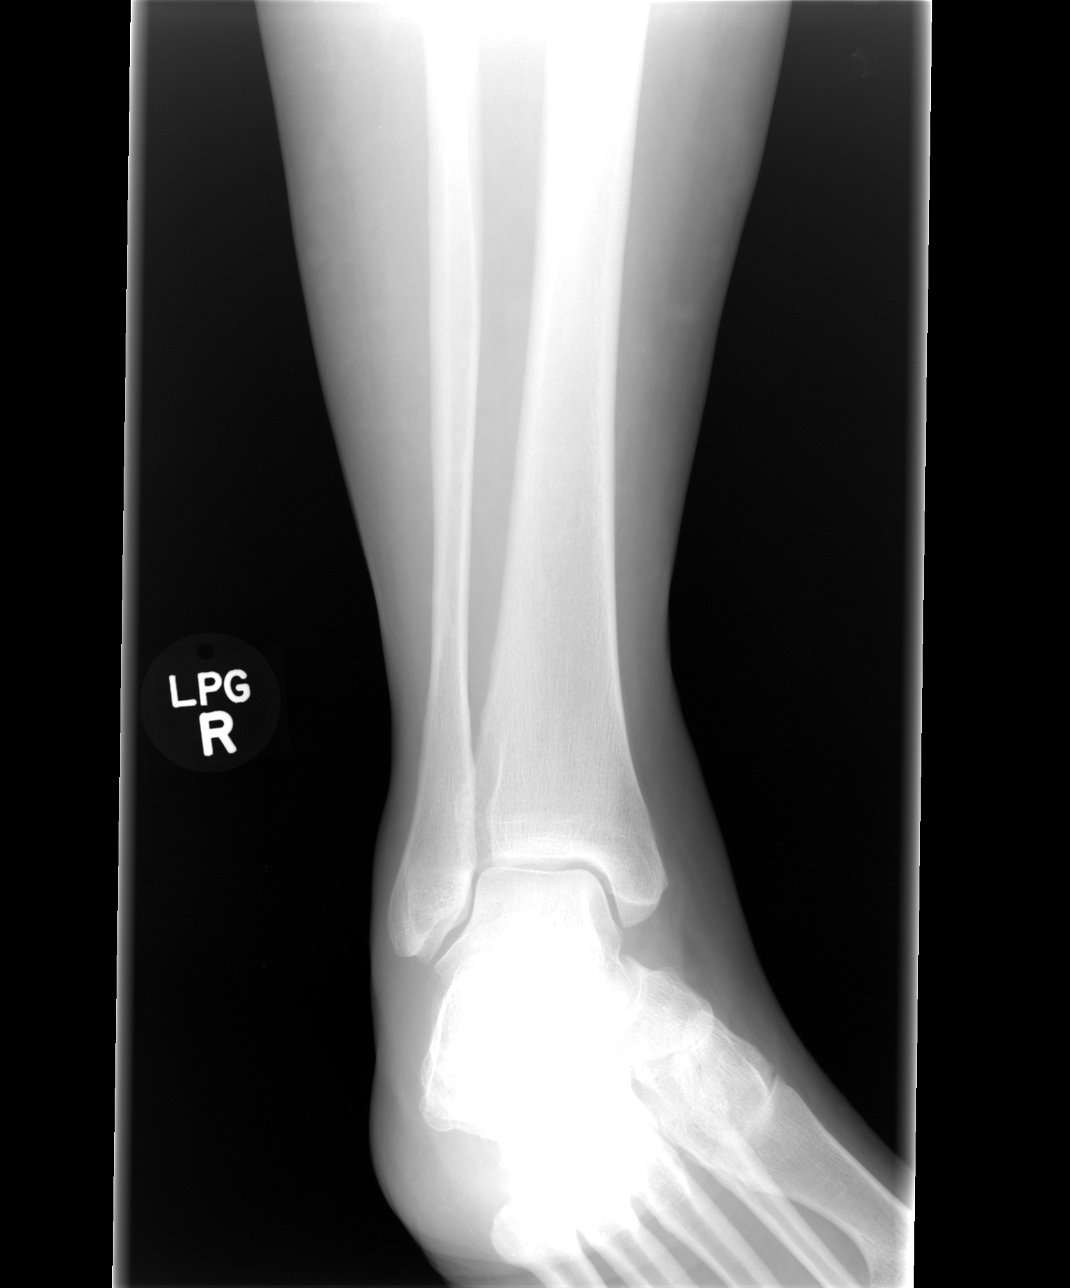

[view not recorded (3 of 3)]
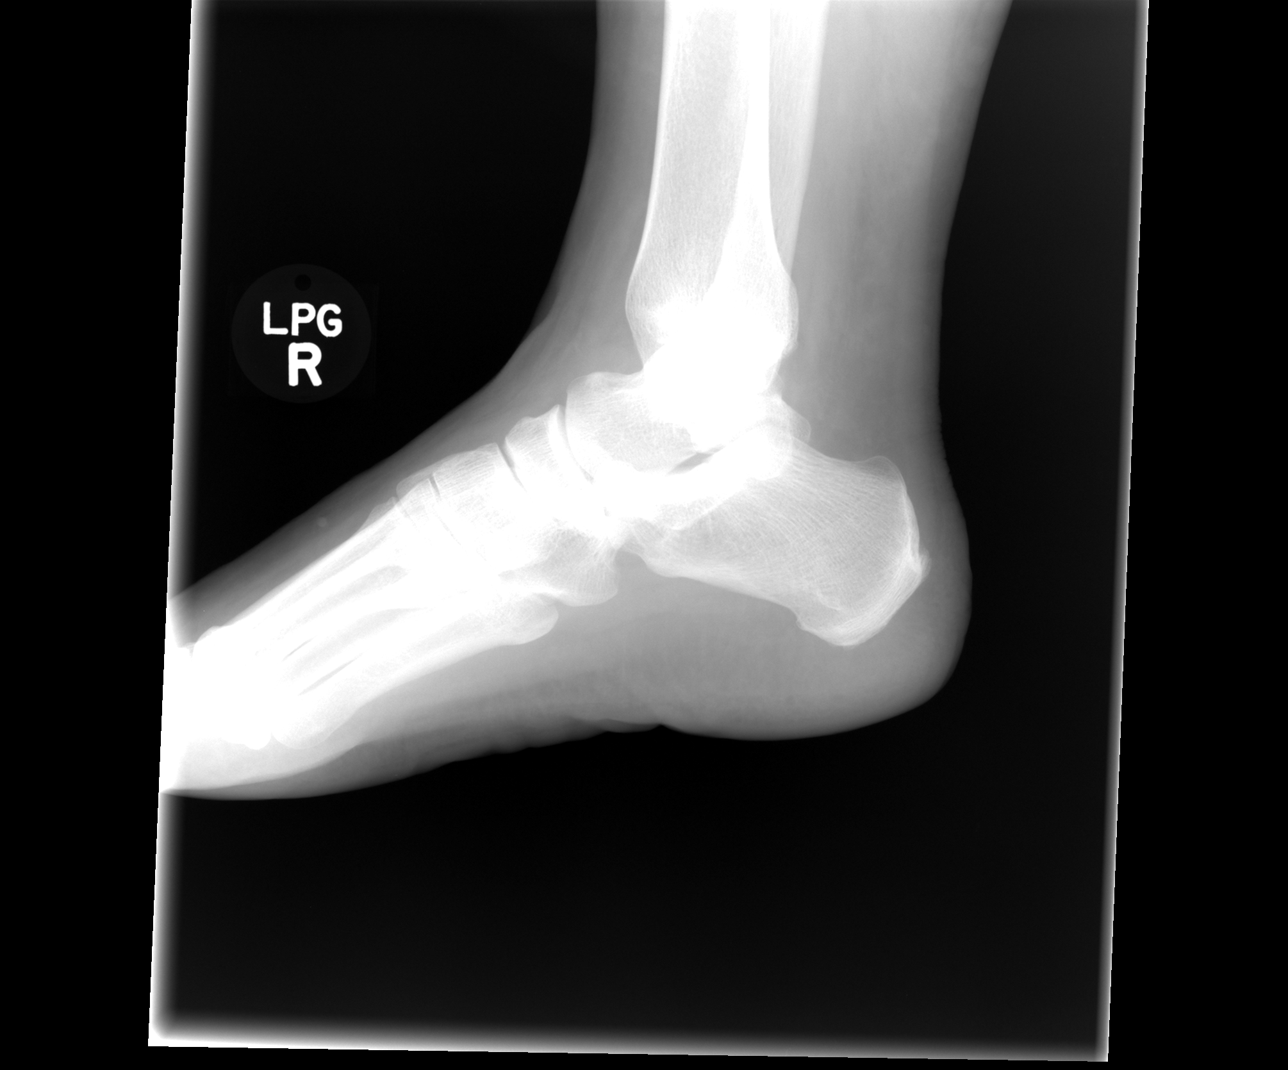

[3 of 3 positions shown; findings below may reference images not displayed]

FINDINGS: Mild lateral soft tissue swelling. No evidence of fracture or
dislocation at the ankle region. Oblique few raises the question of
a Jones fracture of the base of the fifth metatarsal. If patient is
tender in that location, foot films would be suggested.
IMPRESSION: Lateral soft tissue swelling.  No evidence of ankle injury.

Is the patient tender at the base of the fifth metatarsal? Cannot
rule out Jones fracture on the basis of the oblique image.

## 2019-06-09 ENCOUNTER — Other Ambulatory Visit: Payer: Self-pay | Admitting: Otolaryngology

## 2019-07-01 ENCOUNTER — Other Ambulatory Visit: Payer: Self-pay | Admitting: Otolaryngology

## 2019-07-04 ENCOUNTER — Encounter (HOSPITAL_BASED_OUTPATIENT_CLINIC_OR_DEPARTMENT_OTHER): Payer: Self-pay | Admitting: *Deleted

## 2019-07-04 ENCOUNTER — Encounter (HOSPITAL_BASED_OUTPATIENT_CLINIC_OR_DEPARTMENT_OTHER)
Admission: RE | Admit: 2019-07-04 | Discharge: 2019-07-04 | Disposition: A | Discharge status: Home / Self Care | Payer: PRIVATE HEALTH INSURANCE | Source: Ambulatory Visit | Attending: Otolaryngology | Admitting: Otolaryngology

## 2019-07-04 ENCOUNTER — Other Ambulatory Visit: Payer: Self-pay

## 2019-07-04 DIAGNOSIS — Z01812 Encounter for preprocedural laboratory examination: Secondary | ICD-10-CM | POA: Insufficient documentation

## 2019-07-05 ENCOUNTER — Other Ambulatory Visit (HOSPITAL_COMMUNITY)
Admission: RE | Admit: 2019-07-05 | Discharge: 2019-07-05 | Disposition: A | Discharge status: Home / Self Care | Payer: Self-pay | Source: Ambulatory Visit | Attending: Otolaryngology | Admitting: Otolaryngology

## 2019-07-05 DIAGNOSIS — Z01812 Encounter for preprocedural laboratory examination: Secondary | ICD-10-CM | POA: Insufficient documentation

## 2019-07-05 DIAGNOSIS — Z20828 Contact with and (suspected) exposure to other viral communicable diseases: Secondary | ICD-10-CM | POA: Insufficient documentation

## 2019-07-07 LAB — NOVEL CORONAVIRUS, NAA (HOSP ORDER, SEND-OUT TO REF LAB; TAT 18-24 HRS): SARS-CoV-2, NAA: NOT DETECTED

## 2019-07-07 NOTE — Anesthesia Preprocedure Evaluation (Addendum)
Anesthesia Evaluation  Patient identified by MRN, date of birth, ID band Patient awake    Reviewed: Allergy & Precautions, NPO status , Patient's Chart, lab work & pertinent test results  Airway Mallampati: II  TM Distance: >3 FB Neck ROM: Full    Dental no notable dental hx. (+) Teeth Intact, Implants   Pulmonary neg pulmonary ROS,    Pulmonary exam normal breath sounds clear to auscultation       Cardiovascular Exercise Tolerance: Good hypertension, Normal cardiovascular exam Rhythm:Regular Rate:Normal     Neuro/Psych negative neurological ROS  negative psych ROS   GI/Hepatic negative GI ROS,   Endo/Other  negative endocrine ROS  Renal/GU negative Renal ROS     Musculoskeletal negative musculoskeletal ROS (+)   Abdominal   Peds  Hematology negative hematology ROS (+)   Anesthesia Other Findings   Reproductive/Obstetrics negative OB ROS                            Anesthesia Physical Anesthesia Plan  ASA: II  Anesthesia Plan: General   Post-op Pain Management:    Induction: Intravenous  PONV Risk Score and Plan: 3 and Treatment may vary due to age or medical condition and Ondansetron  Airway Management Planned: LMA  Additional Equipment:   Intra-op Plan:   Post-operative Plan:   Informed Consent: I have reviewed the patients History and Physical, chart, labs and discussed the procedure including the risks, benefits and alternatives for the proposed anesthesia with the patient or authorized representative who has indicated his/her understanding and acceptance.     Dental advisory given  Plan Discussed with:   Anesthesia Plan Comments: (Hx taken w translator)       Anesthesia Quick Evaluation

## 2019-07-08 ENCOUNTER — Ambulatory Visit (HOSPITAL_BASED_OUTPATIENT_CLINIC_OR_DEPARTMENT_OTHER): Payer: Self-pay | Admitting: Anesthesiology

## 2019-07-08 ENCOUNTER — Encounter (HOSPITAL_BASED_OUTPATIENT_CLINIC_OR_DEPARTMENT_OTHER): Payer: Self-pay | Admitting: Emergency Medicine

## 2019-07-08 ENCOUNTER — Ambulatory Visit (HOSPITAL_BASED_OUTPATIENT_CLINIC_OR_DEPARTMENT_OTHER)
Admission: RE | Admit: 2019-07-08 | Discharge: 2019-07-08 | Disposition: A | Discharge status: Home / Self Care | Payer: Self-pay | Source: Ambulatory Visit | Attending: Otolaryngology | Admitting: Otolaryngology

## 2019-07-08 ENCOUNTER — Encounter (HOSPITAL_BASED_OUTPATIENT_CLINIC_OR_DEPARTMENT_OTHER)
Admission: RE | Disposition: A | Discharge status: Home / Self Care | Payer: Self-pay | Source: Ambulatory Visit | Attending: Otolaryngology

## 2019-07-08 ENCOUNTER — Other Ambulatory Visit: Payer: Self-pay

## 2019-07-08 DIAGNOSIS — H9312 Tinnitus, left ear: Secondary | ICD-10-CM | POA: Insufficient documentation

## 2019-07-08 DIAGNOSIS — I10 Essential (primary) hypertension: Secondary | ICD-10-CM | POA: Insufficient documentation

## 2019-07-08 DIAGNOSIS — H902 Conductive hearing loss, unspecified: Secondary | ICD-10-CM | POA: Insufficient documentation

## 2019-07-08 DIAGNOSIS — H9041 Sensorineural hearing loss, unilateral, right ear, with unrestricted hearing on the contralateral side: Secondary | ICD-10-CM | POA: Insufficient documentation

## 2019-07-08 DIAGNOSIS — H7292 Unspecified perforation of tympanic membrane, left ear: Secondary | ICD-10-CM | POA: Insufficient documentation

## 2019-07-08 DIAGNOSIS — R519 Headache, unspecified: Secondary | ICD-10-CM | POA: Insufficient documentation

## 2019-07-08 DIAGNOSIS — H9209 Otalgia, unspecified ear: Secondary | ICD-10-CM | POA: Insufficient documentation

## 2019-07-08 HISTORY — PX: TYMPANOPLASTY: SHX33

## 2019-07-08 SURGERY — TYMPANOPLASTY
Anesthesia: General | Site: Ear | Laterality: Left

## 2019-07-08 MED ORDER — ONDANSETRON HCL 4 MG/2ML IJ SOLN
INTRAMUSCULAR | Status: DC | PRN
  Administered 2019-07-08: 4 mg via INTRAVENOUS

## 2019-07-08 MED ORDER — DEXAMETHASONE SODIUM PHOSPHATE 10 MG/ML IJ SOLN
INTRAMUSCULAR | Status: AC
  Filled 2019-07-08: qty 1

## 2019-07-08 MED ORDER — LIDOCAINE-EPINEPHRINE 1 %-1:100000 IJ SOLN
INTRAMUSCULAR | Status: AC
  Filled 2019-07-08: qty 1

## 2019-07-08 MED ORDER — ACETAMINOPHEN 500 MG PO TABS
1000.0000 mg | ORAL_TABLET | Freq: Once | ORAL | Status: AC
  Administered 2019-07-08: 07:00:00 1000 mg via ORAL

## 2019-07-08 MED ORDER — BACITRACIN ZINC 500 UNIT/GM EX OINT
TOPICAL_OINTMENT | CUTANEOUS | Status: DC | PRN
  Administered 2019-07-08: 1 via TOPICAL

## 2019-07-08 MED ORDER — FENTANYL CITRATE (PF) 100 MCG/2ML IJ SOLN
INTRAMUSCULAR | Status: AC
  Filled 2019-07-08: qty 2

## 2019-07-08 MED ORDER — LIDOCAINE HCL (CARDIAC) PF 100 MG/5ML IV SOSY
PREFILLED_SYRINGE | INTRAVENOUS | Status: DC | PRN
  Administered 2019-07-08: 100 mg via INTRAVENOUS

## 2019-07-08 MED ORDER — FENTANYL CITRATE (PF) 100 MCG/2ML IJ SOLN
INTRAMUSCULAR | Status: DC | PRN
  Administered 2019-07-08 (×4): 25 ug via INTRAVENOUS

## 2019-07-08 MED ORDER — MEPERIDINE HCL 25 MG/ML IJ SOLN: 6.2500 mg | INTRAMUSCULAR | Status: DC | PRN

## 2019-07-08 MED ORDER — CIPROFLOXACIN-FLUOCINOLONE PF 0.3-0.025 % OT SOLN
OTIC | Status: AC
  Filled 2019-07-08: qty 0.25

## 2019-07-08 MED ORDER — PROPOFOL 10 MG/ML IV BOLUS
INTRAVENOUS | Status: DC | PRN
  Administered 2019-07-08: 150 mg via INTRAVENOUS

## 2019-07-08 MED ORDER — DEXAMETHASONE SODIUM PHOSPHATE 10 MG/ML IJ SOLN
INTRAMUSCULAR | Status: DC | PRN
  Administered 2019-07-08: 5 mg via INTRAVENOUS

## 2019-07-08 MED ORDER — EPHEDRINE SULFATE-NACL 50-0.9 MG/10ML-% IV SOSY
PREFILLED_SYRINGE | INTRAVENOUS | Status: DC | PRN
  Administered 2019-07-08 (×4): 5 mg via INTRAVENOUS

## 2019-07-08 MED ORDER — OXYCODONE HCL 5 MG PO TABS: 5.0000 mg | ORAL_TABLET | Freq: Once | ORAL | Status: DC | PRN

## 2019-07-08 MED ORDER — EPINEPHRINE PF 1 MG/ML IJ SOLN
INTRAMUSCULAR | Status: AC
  Filled 2019-07-08: qty 1

## 2019-07-08 MED ORDER — ONDANSETRON HCL 4 MG/2ML IJ SOLN: 4.0000 mg | Freq: Once | INTRAMUSCULAR | Status: DC | PRN

## 2019-07-08 MED ORDER — BACITRACIN ZINC 500 UNIT/GM EX OINT
TOPICAL_OINTMENT | CUTANEOUS | Status: AC
  Filled 2019-07-08: qty 0.9

## 2019-07-08 MED ORDER — CEFAZOLIN SODIUM-DEXTROSE 2-3 GM-%(50ML) IV SOLR
INTRAVENOUS | Status: DC | PRN
  Administered 2019-07-08: 2 g via INTRAVENOUS

## 2019-07-08 MED ORDER — FENTANYL CITRATE (PF) 100 MCG/2ML IJ SOLN: 25.0000 ug | INTRAMUSCULAR | Status: DC | PRN

## 2019-07-08 MED ORDER — OXYCODONE HCL 5 MG/5ML PO SOLN: 5.0000 mg | Freq: Once | ORAL | Status: DC | PRN

## 2019-07-08 MED ORDER — MIDAZOLAM HCL 2 MG/2ML IJ SOLN
INTRAMUSCULAR | Status: DC | PRN
  Administered 2019-07-08: 2 mg via INTRAVENOUS

## 2019-07-08 MED ORDER — ONDANSETRON HCL 4 MG/2ML IJ SOLN
INTRAMUSCULAR | Status: AC
  Filled 2019-07-08: qty 2

## 2019-07-08 MED ORDER — EPHEDRINE 5 MG/ML INJ
INTRAVENOUS | Status: AC
  Filled 2019-07-08: qty 10

## 2019-07-08 MED ORDER — LACTATED RINGERS IV SOLN
INTRAVENOUS | Status: DC
  Administered 2019-07-08 (×2): via INTRAVENOUS

## 2019-07-08 MED ORDER — ACETAMINOPHEN 500 MG PO TABS
ORAL_TABLET | ORAL | Status: AC
  Filled 2019-07-08: qty 2

## 2019-07-08 MED ORDER — LIDOCAINE 2% (20 MG/ML) 5 ML SYRINGE
INTRAMUSCULAR | Status: AC
  Filled 2019-07-08: qty 5

## 2019-07-08 MED ORDER — PROPOFOL 10 MG/ML IV BOLUS
INTRAVENOUS | Status: AC
  Filled 2019-07-08: qty 20

## 2019-07-08 MED ORDER — EPINEPHRINE PF 1 MG/ML IJ SOLN
INTRAMUSCULAR | Status: DC | PRN
  Administered 2019-07-08: 1 mg

## 2019-07-08 MED ORDER — AMOXICILLIN 875 MG PO TABS: 875.0000 mg | ORAL_TABLET | Freq: Two times a day (BID) | ORAL | 0 refills | Status: AC

## 2019-07-08 MED ORDER — CIPROFLOXACIN-FLUOCINOLONE PF 0.3-0.025 % OT SOLN
OTIC | Status: DC | PRN
  Administered 2019-07-08 (×2): 0.25 mL via OTIC

## 2019-07-08 MED ORDER — PHENYLEPHRINE 40 MCG/ML (10ML) SYRINGE FOR IV PUSH (FOR BLOOD PRESSURE SUPPORT)
PREFILLED_SYRINGE | INTRAVENOUS | Status: DC | PRN
  Administered 2019-07-08 (×10): 80 ug via INTRAVENOUS

## 2019-07-08 MED ORDER — LIDOCAINE-EPINEPHRINE 1 %-1:100000 IJ SOLN
INTRAMUSCULAR | Status: DC | PRN
  Administered 2019-07-08: 5 mL

## 2019-07-08 MED ORDER — ACETAMINOPHEN 10 MG/ML IV SOLN: 1000.0000 mg | Freq: Once | INTRAVENOUS | Status: DC | PRN

## 2019-07-08 MED ORDER — OXYMETAZOLINE HCL 0.05 % NA SOLN
NASAL | Status: AC
  Filled 2019-07-08: qty 30

## 2019-07-08 MED ORDER — MIDAZOLAM HCL 2 MG/2ML IJ SOLN
INTRAMUSCULAR | Status: AC
  Filled 2019-07-08: qty 2

## 2019-07-08 MED ORDER — MIDAZOLAM HCL 2 MG/2ML IJ SOLN: 1.0000 mg | INTRAMUSCULAR | Status: DC | PRN

## 2019-07-08 MED ORDER — FENTANYL CITRATE (PF) 100 MCG/2ML IJ SOLN: 50.0000 ug | INTRAMUSCULAR | Status: DC | PRN

## 2019-07-08 SURGICAL SUPPLY — 57 items
BLADE CLIPPER SURG (BLADE) ×2 IMPLANT
BLADE NDL 3 SS STRL (BLADE) IMPLANT
BLADE NEEDLE 3 SS STRL (BLADE) IMPLANT
BLADE NEEDLE 3MM SS STRL (BLADE)
CANISTER SUCT 1200ML W/VALVE (MISCELLANEOUS) ×3 IMPLANT
CORD BIPOLAR FORCEPS 12FT (ELECTRODE) IMPLANT
COTTONBALL LRG STERILE PKG (GAUZE/BANDAGES/DRESSINGS) ×3 IMPLANT
COVER WAND RF STERILE (DRAPES) IMPLANT
DECANTER SPIKE VIAL GLASS SM (MISCELLANEOUS) ×3 IMPLANT
DERMABOND ADVANCED (GAUZE/BANDAGES/DRESSINGS) ×2
DERMABOND ADVANCED .7 DNX12 (GAUZE/BANDAGES/DRESSINGS) IMPLANT
DRAPE MICROSCOPE WILD 40.5X102 (DRAPES) ×3 IMPLANT
DRAPE SURG 17X23 STRL (DRAPES) ×3 IMPLANT
DRSG GLASSCOCK MASTOID ADT (GAUZE/BANDAGES/DRESSINGS) IMPLANT
DRSG GLASSCOCK MASTOID PED (GAUZE/BANDAGES/DRESSINGS) IMPLANT
ELECT COATED BLADE 2.86 ST (ELECTRODE) ×3 IMPLANT
ELECT REM PT RETURN 9FT ADLT (ELECTROSURGICAL) ×3
ELECTRODE REM PT RTRN 9FT ADLT (ELECTROSURGICAL) ×1 IMPLANT
GAUZE SPONGE 4X4 12PLY STRL (GAUZE/BANDAGES/DRESSINGS) IMPLANT
GAUZE SPONGE 4X4 12PLY STRL LF (GAUZE/BANDAGES/DRESSINGS) IMPLANT
GLOVE BIO SURGEON STRL SZ 6.5 (GLOVE) ×1 IMPLANT
GLOVE BIO SURGEON STRL SZ7.5 (GLOVE) ×3 IMPLANT
GLOVE BIO SURGEONS STRL SZ 6.5 (GLOVE) ×1
GLOVE BIOGEL PI IND STRL 7.0 (GLOVE) IMPLANT
GLOVE BIOGEL PI INDICATOR 7.0 (GLOVE) ×6
GLOVE ECLIPSE 6.5 STRL STRAW (GLOVE) ×2 IMPLANT
GOWN STRL REUS W/ TWL LRG LVL3 (GOWN DISPOSABLE) ×2 IMPLANT
GOWN STRL REUS W/TWL LRG LVL3 (GOWN DISPOSABLE) ×6
IV CATH AUTO 14GX1.75 SAFE ORG (IV SOLUTION) ×3 IMPLANT
IV NS 500ML (IV SOLUTION)
IV NS 500ML BAXH (IV SOLUTION) IMPLANT
IV SET EXT 30 76VOL 4 MALE LL (IV SETS) ×3 IMPLANT
NDL FILTER BLUNT 18X1 1/2 (NEEDLE) IMPLANT
NDL HYPO 25X1 1.5 SAFETY (NEEDLE) ×1 IMPLANT
NDL SAFETY ECLIPSE 18X1.5 (NEEDLE) ×1 IMPLANT
NEEDLE FILTER BLUNT 18X 1/2SAF (NEEDLE) ×2
NEEDLE FILTER BLUNT 18X1 1/2 (NEEDLE) ×1 IMPLANT
NEEDLE HYPO 18GX1.5 SHARP (NEEDLE) ×2
NEEDLE HYPO 25X1 1.5 SAFETY (NEEDLE) ×3 IMPLANT
NS IRRIG 1000ML POUR BTL (IV SOLUTION) ×3 IMPLANT
PACK BASIN DAY SURGERY FS (CUSTOM PROCEDURE TRAY) ×3 IMPLANT
PACK ENT DAY SURGERY (CUSTOM PROCEDURE TRAY) ×3 IMPLANT
PENCIL BUTTON HOLSTER BLD 10FT (ELECTRODE) ×3 IMPLANT
SLEEVE IRRIGATION ELITE 7 (MISCELLANEOUS) IMPLANT
SLEEVE SCD COMPRESS KNEE MED (MISCELLANEOUS) ×2 IMPLANT
SPONGE SURGIFOAM ABS GEL 12-7 (HEMOSTASIS) ×3 IMPLANT
SUT VIC AB 3-0 SH 27 (SUTURE)
SUT VIC AB 3-0 SH 27X BRD (SUTURE) IMPLANT
SUT VIC AB 4-0 P-3 18XBRD (SUTURE) IMPLANT
SUT VIC AB 4-0 P3 18 (SUTURE)
SUT VICRYL 4-0 PS2 18IN ABS (SUTURE) ×2 IMPLANT
SYR 3ML 18GX1 1/2 (SYRINGE) ×5 IMPLANT
SYR 5ML LL (SYRINGE) ×2 IMPLANT
SYR BULB 3OZ (MISCELLANEOUS) ×2 IMPLANT
TOWEL GREEN STERILE FF (TOWEL DISPOSABLE) ×3 IMPLANT
TRAY DSU PREP LF (CUSTOM PROCEDURE TRAY) ×3 IMPLANT
TUBING IRRIGATION (MISCELLANEOUS) IMPLANT

## 2019-07-08 NOTE — Discharge Instructions (Addendum)
POSTOPERATIVE INSTRUCTIONS FOR PATIENTS HAVING A TYMPANOPLASTY 1. Avoid undue fatigue or exposure to colds or upper respiratory infections if possible. 2. Do not blow your nose for approximately one week following surgery. Any accumulated secretions in the nose should be drawn back and expectorated through the mouth to avoid infecting the ear. If you sneeze, do so with your mouth open. Do not hold your nose to avoid sneezing. Do not play musical wind instruments for 3 weeks. 3. Wash your hands with soap and water before treating the ear. 4. A clean cloth moistened with warm water may be used to clean the outer ear as often as necessary for cleanliness and comfort. Do not allow water to enter the ear canal for at least three weeks. 5. You may shampoo your hair 48 hours following surgery, provided that water is not allowed to enter your ear canal. Water can be kept out of your ear canal by placing a cotton ball in the ear opening and applying Vaseline over the cotton to form a water tight seal. 6. If ear drops are to be instilled, position the head with the affected ear up during the instillation and remain in this position for five to ten minutes to facilitate the absorption of the drops. Then place a clean cotton ball in the ear for about an hour. 7. The ear should be exposed to the air as much as possible. A cotton ball should be placed in the ear canal during the day while combing the hair, during exposure to a dusty environment, and at night to prevent drainage onto your pillow. At first, the drainage may be red-brown to brown in color, but the brown drainage usually becomes clear and disappears within a week or two. If drainage increases, call our office, [phone]. 8. If your physician prescribes an antibiotic, fill the prescription promptly and take all of the medicine as directed until the entire supply is gone. 9. If any of the following should occur, contact your physician: a. Persistent  bleeding b. Persistent fever c. Purulent drainage (pus) from the ear or incision d. Increasing redness around the suture line e. Persistent pain or dizziness f. Facial weakness g. Rash around the ear or incision 10. Do not be overly concerned about your hearing until at least one month postoperatively. Your hearing may fluctuate as the ear heals. You may also experience some popping and cracking sounds in the ear for up to several weeks. It may sound like you are talking in a barrel or a tunnel. This is normal and should not cause concern. 11. You may notice a metallic taste in your mouth for several weeks after ear surgery. The taste will usually go away spontaneously. 12. Please ask your surgeon if any of the middle ear ossicles were replaced with metal parts. This may be important to know if you ever need to have a magnetic resonance imaging scan (MRI) in the future. 13. It is important for you to return for your scheduled appointments.    ---------------------------  Excuse from Work, [employer], or Physical Activity __Graciela Guardado__ needs to be excused from: _x__ Work. ____ School. ____ Physical activity. This is effective for the following dates: _10/23/2020 - 10/30/2020_. He or she may return to work on  _10/31/2020______________.  Health care provider: __Su Philomena Doheny, MD_____ Date: __10/23/2020____ This information is not intended to replace advice given to you by your health care provider. Make sure you discuss any questions you have with your health care provider.  Document Released: 02/25/2001 Document Revised: 08/27/2017 Document Reviewed: 08/27/2017 Elsevier Patient Education  [place] Instructions  Activity: Get plenty of rest for the remainder of the day. A responsible individual must stay with you for 24 hours following the procedure.  For the next 24 hours, DO NOT: -Drive a car -Paediatric nurse -Drink alcoholic  beverages -Take any medication unless instructed by your physician -Make any legal decisions or sign important papers.  Meals: Start with liquid foods such as gelatin or soup. Progress to regular foods as tolerated. Avoid greasy, spicy, heavy foods. If nausea and/or vomiting occur, drink only clear liquids until the nausea and/or vomiting subsides. Call your physician if vomiting continues.  Special Instructions/Symptoms: Your throat may feel dry or sore from the anesthesia or the breathing tube placed in your throat during surgery. If this causes discomfort, gargle with warm salt water. The discomfort should disappear within 24 hours.  If you had a scopolamine patch placed behind your ear for the management of post- operative nausea and/or vomiting:  1. The medication in the patch is effective for 72 hours, after which it should be removed.  Wrap patch in a tissue and discard in the trash. Wash hands thoroughly with soap and water. 2. You may remove the patch earlier than 72 hours if you experience unpleasant side effects which may include dry mouth, dizziness or visual disturbances. 3. Avoid touching the patch. Wash your hands with soap and water after contact with the patch.   No tylenol until after 1pm today.

## 2019-07-08 NOTE — Transfer of Care (Signed)
Immediate Anesthesia Transfer of Care Note  Patient: Cathy Duran  Procedure(s) Performed: LEFT TYMPANOPLASTY (Left Ear)  Patient Location: PACU  Anesthesia Type:General  Level of Consciousness: awake, alert , oriented, drowsy and patient cooperative  Airway & Oxygen Therapy: Patient Spontanous Breathing  Post-op Assessment: Report given to RN and Post -op Vital signs reviewed and stable  Post vital signs: Reviewed and stable  Last Vitals:  Vitals Value Taken Time  BP 134/68 07/08/19 0916  Temp    Pulse 92 07/08/19 0918  Resp 20 07/08/19 0918  SpO2 97 % 07/08/19 0918  Vitals shown include unvalidated device data.  Last Pain:  Vitals:   07/08/19 0652  PainSc: 0-No pain         Complications: No apparent anesthesia complications

## 2019-07-08 NOTE — Op Note (Signed)
DATE OF PROCEDURE: 07/08/2019  OPERATIVE REPORT   SURGEON: Leta Baptist, MD  PREOPERATIVE DIAGNOSIS: Left tympanic membrane perforation.  POSTOPERATIVE DIAGNOSIS: Left tympanic membrane perforation.  PROCEDURES PERFORMED: 1. Left transcanal tympanoplasty.  ANESTHESIA: General laryngeal mask anesthesia.  COMPLICATIONS: None.  ESTIMATED BLOOD LOSS: Minimal.  INDICATION FOR PROCEDURE:  Jenniger Figiel is a 53 y.o. female with a history of left ear hearing loss.  At the last visit, a nearly 90% TM perforation was noted. The patient was also noted to have conductive hearing loss secondary to the tympanic membrane perforation. Based on the above findings, the decision was made for the patient to undergo the above-stated procedures. The risks, benefits, alternatives, and details of the procedures were discussed with the mother. Questions were invited and answered. Informed consent was obtained.  DESCRIPTION OF PROCEDURE: The patient was taken to the operating room and placed supine on the operating table. General laryngeal mask anesthesia was induced by the anesthesiologist. Under the operating microscope, the left ear canal was cleaned of all cerumen. A rim of fibrotic tissue was removed circumferentially from the perforation. A standard tympanomeatal flap was elevated in a standard fashion. No other pathology was noted.  Attention was then focused on obtaining the temporalis fascia graft. A separate postauricular incision was made. Incision was carried down to the level of the temporalis fascia. A 2 x 2 cm temporalis fascia graft was harvested in a standard fashion. Hemostasis was achieved with Bovie electrocautery. The surgical site was copiously irrigated. The incision was closed in layers with 4-0 Vicryl and Dermabond.  Under the operating microscope, the harvested graft was inserted via the ear canal into the middle ear space. It was used to cover the TM  perforation. Gelfoam was used to pack the middle ear and ear canal, sandwiching the neotympanum. Ciprodex ear drops were applied. Antibiotic ointment was applied to the ear canal. That concluded the procedure for the patient. The care of the patient was turned over to the anesthesiologist. The patient was awakened from anesthesia without difficulty. She was extubated and transferred to the recovery room in good condition.  OPERATIVE FINDINGS: A 90% TM perforation was noted.  SPECIMEN: None.  FOLLOWUP CARE: The patient will be discharged home once she is awake and alert. She will follow up in my office in 1 week.

## 2019-07-08 NOTE — Anesthesia Procedure Notes (Signed)
Procedure Name: LMA Insertion Date/Time: 07/08/2019 7:31 AM Performed by: Raenette Rover, CRNA Pre-anesthesia Checklist: Patient identified, Emergency Drugs available, Suction available and Patient being monitored Patient Re-evaluated:Patient Re-evaluated prior to induction Oxygen Delivery Method: Circle system utilized Preoxygenation: Pre-oxygenation with 100% oxygen Induction Type: IV induction LMA: LMA inserted LMA Size: 4.0 Number of attempts: 1 Placement Confirmation: positive ETCO2 and breath sounds checked- equal and bilateral Tube secured with: Tape Dental Injury: Teeth and Oropharynx as per pre-operative assessment

## 2019-07-08 NOTE — H&P (Signed)
Cc: Left ear hearing loss, left TM perforation  HPI: The patient is a 53 y/o female who presents today with her son as her interpreter for evaluation of left ear hearing loss. The patient is seen in consultation requested by [facility]. The patient was noted by her PCP to have left ear hearing loss. She was recently seen at [facility] and noted to have significant left conductive hearing loss. The patient has not noted any issues with her hearing. The only otologic problem the patient has had in the past was a bad ear infection when she was a child. The patient has noted occasional ear pain. She also complains of headaches and feeling hot. No otorrhea or vertigo is noted. The patient does note intermittent noises in her left ear. Previous otologic surgery is denied.    The patient's review of systems (constitutional, eyes, ENT, cardiovascular, respiratory, GI, musculoskeletal, skin, neurologic, psychiatric, endocrine, hematologic, allergic) is noted in the ROS questionnaire.  It is reviewed with the patient and her son.   Family health history: No HTN, DM, CAD, hearing loss or bleeding disorder.  Major events: None.  Ongoing medical problems: Hypertension, headache.  Social history: The patient is married.  She denies the use of tobacco, alcohol, or illegal drugs..   Exam: General: Communicates without difficulty, well nourished, no acute distress. Head: Normocephalic, no evidence injury, no tenderness, facial buttresses intact without stepoff. Eyes: PERRL, EOMI. No scleral icterus, conjunctivae clear. Neuro: CN II exam reveals vision grossly intact.  No nystagmus at any point of gaze. Ears: Auricles well formed without lesions.  Ear canals are intact without mass or lesion.  No erythema or edema is appreciated.  The right TM is intact. A near complete, dry perforation is noted on the left. Nose: External evaluation reveals normal support and skin without lesions.  Dorsum is intact.  Anterior  rhinoscopy reveals healthy pink mucosa over anterior aspect of inferior turbinates and intact septum.  No purulence noted. Oral:  Oral cavity and oropharynx are intact, symmetric, without erythema or edema.  Mucosa is moist without lesions. Neck: Full range of motion without pain.  There is no significant lymphadenopathy.  No masses palpable.  Thyroid bed within normal limits to palpation.  Parotid glands and submandibular glands equal bilaterally without mass.  Trachea is midline. Neuro:  CN 2-12 grossly intact. Gait normal. Vestibular: No nystagmus at any point of gaze. The cerebellar examination is unremarkable.   Assessment 1. 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 Significant conductive hearing loss was noted on recent audiogram from [facility].  2. The right TM is intact and mobile. Minimal high frequency sensorineural hearing loss is noted on the right.  3. 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   Plan 1. The physical exam and hearing test findings are reviewed with the patient and her son.  2. Treatment options include strict dry ear precautions, tympanoplasty, versus left ear hearing aid. The risks, benefits, alternatives, and details of the procedure are reviewed with the patient. Questions are invited and answered. 3. Strategies of coping with tinnitus, which include the use of masker, hearing aids, avoidance of caffeine and alcohol, and tinnitus retraining therapy are discussed with the patient.  4. The patient would like to proceed with the tympanoplasty procedure.

## 2019-07-08 NOTE — Anesthesia Postprocedure Evaluation (Signed)
Anesthesia Post Note  Patient: Cathy Duran  Procedure(s) Performed: LEFT TYMPANOPLASTY (Left Ear)     Patient location during evaluation: PACU Anesthesia Type: General Level of consciousness: awake and alert Pain management: pain level controlled Vital Signs Assessment: post-procedure vital signs reviewed and stable Respiratory status: spontaneous breathing, nonlabored ventilation, respiratory function stable and patient connected to nasal cannula oxygen Cardiovascular status: blood pressure returned to baseline and stable Postop Assessment: no apparent nausea or vomiting Anesthetic complications: no    Last Vitals:  Vitals:   07/08/19 0930 07/08/19 1000  BP: 137/76 (!) 143/78  Pulse: 95 92  Resp: 13 16  Temp:  37.1 C  SpO2: 98% 99%    Last Pain:  Vitals:   07/08/19 1000  PainSc: 0-No pain                 Barnet Glasgow

## 2019-07-11 ENCOUNTER — Encounter (HOSPITAL_BASED_OUTPATIENT_CLINIC_OR_DEPARTMENT_OTHER): Payer: Self-pay | Admitting: Otolaryngology

## 2019-12-29 ENCOUNTER — Encounter: Payer: Self-pay | Admitting: Neurology

## 2020-03-08 NOTE — Progress Notes (Signed)
NEUROLOGY CONSULTATION NOTE  Cathy Duran MRN: [id number] DOB: May 26, 1966  Referring provider: Tresa Moore, FNP Primary care provider: Tresa Moore, FNP  Reason for consult:  Altered sensation on head  HISTORY OF PRESENT ILLNESS: Cathy Duran is a 54 year old right-handed female who presents for altered sensation on head.  History supplemented by referring provider's note.  Spanish-speaking interpreter present.  For the past 2 years, she experiences burning and tingling sensation involving the entire right frontal and temporal region as well as top of head.  No actual headache and no neck pain.  It lasts 3 days and occurs once a week.  She also reports hair loss over the same period of time.  She reports that she has trouble sleeping but not due to the burning.  She reports work-related stress (works in [employer])  She was started on carbamazepine for presumed trigeminal neuralgia, which was subsequently changed to paroxetine, however she feels it is worse on paroxetine  MRI of brain with and without contrast on 08/23/2019 was normal.    Current medications:  Paroxetine 10mg  daily, Robaxin, lisinopril, atorvastatin.  Past medications:  Carbamazepine, Robaxin  PAST MEDICAL HISTORY: Past Medical History:  Diagnosis Date  . High cholesterol   . Hypertension     PAST SURGICAL HISTORY: Past Surgical History:  Procedure Laterality Date  . TUBAL LIGATION    . TYMPANOPLASTY Left 07/08/2019   Procedure: LEFT TYMPANOPLASTY;  Surgeon: Leta Baptist, MD;  Location: [place];  Service: ENT;  Laterality: Left;    MEDICATIONS: Current Outpatient Medications on File Prior to Visit  Medication Sig Dispense Refill  . atorvastatin (LIPITOR) 20 MG tablet Take 20 mg by mouth daily.    Marland Kitchen lisinopril (ZESTRIL) 10 MG tablet Take 10 mg by mouth daily.    . methocarbamol (ROBAXIN) 750 MG tablet Take 1 tablet (750 mg total) by mouth 3 (three) times daily as needed for muscle spasms  (pain). 15 tablet 0   No current facility-administered medications on file prior to visit.    ALLERGIES: No Known Allergies  FAMILY HISTORY: Family History  Problem Relation Age of Onset  . Diabetes Mother   . Diabetes Father   . Hypertension Sister    SOCIAL HISTORY: Social History   Socioeconomic History  . Marital status: Married    Spouse name: Not on file  . Number of children: Not on file  . Years of education: Not on file  . Highest education level: Not on file  Occupational History  . Not on file  Tobacco Use  . Smoking status: Never Smoker  . Smokeless tobacco: Never Used  Substance and Sexual Activity  . Alcohol use: No  . Drug use: No  . Sexual activity: Yes    Birth control/protection: Surgical  Other Topics Concern  . Not on file  Social History Narrative  . Not on file   Social Determinants of Health   Financial Resource Strain:   . Difficulty of Paying Living Expenses:   Food Insecurity:   . Worried About Charity fundraiser in the Last Year:   . Arboriculturist in the Last Year:   Transportation Needs:   . Film/video editor (Medical):   Marland Kitchen Lack of Transportation (Non-Medical):   Physical Activity:   . Days of Exercise per Week:   . Minutes of Exercise per Session:   Stress:   . Feeling of Stress :   Social Connections:   . Frequency of Communication  with Friends and Family:   . Frequency of Social Gatherings with Friends and Family:   . Attends Religious Services:   . Active Member of Clubs or Organizations:   . Attends Banker Meetings:   Marland Kitchen Marital Status:   Intimate Partner Violence:   . Fear of Current or Ex-Partner:   . Emotionally Abused:   Marland Kitchen Physically Abused:   . Sexually Abused:      PHYSICAL EXAM: Blood pressure 113/74, pulse 64, height 5\' 3"  (1.6 m), weight 170 lb (77.1 kg), SpO2 98 %. General: No acute distress.  Patient appears well-groomed.   Head:  Normocephalic/atraumatic Eyes:  fundi examined but  not visualized Neck: supple, mild right paraspinal tenderness, full range of motion Back: No paraspinal tenderness Heart: regular rate and rhythm Lungs: Clear to auscultation bilaterally. Vascular: No carotid bruits. Neurological Exam: Mental status: alert and oriented to person, place, and time, recent and remote memory intact, fund of knowledge intact, attention and concentration intact, speech fluent and not dysarthric, language intact. Cranial nerves: CN I: not tested CN II: pupils equal, round and reactive to light, visual fields intact CN III, IV, VI:  full range of motion, no nystagmus, no ptosis CN V: facial sensation intact CN VII: upper and lower face symmetric CN VIII: hearing intact CN IX, X: gag intact, uvula midline CN XI: sternocleidomastoid and trapezius muscles intact CN XII: tongue midline Bulk & Tone: normal, no fasciculations. Motor:  5/5 throughout  Sensation:  Pinprick and vibration sensation intact.  Deep Tendon Reflexes:  2+ throughout, toes downgoing.   Finger to nose testing:  Without dysmetria.   Heel to shin:  Without dysmetria.   Gait:  Normal station and stride.  Romberg negative.  IMPRESSION: Neuralgia of head/scalp.  PLAN: 1.  Stop paroxetine 2.  Start nortriptyline 10mg  at bedtime.  We can increase to 25mg  at bedtime in 4 weeks if needed. 3.  Follow up in 4 months.  Thank you for allowing me to take part in the care of this patient.  , DO

## 2020-03-12 ENCOUNTER — Ambulatory Visit (INDEPENDENT_AMBULATORY_CARE_PROVIDER_SITE_OTHER): Payer: Commercial Managed Care - PPO | Admitting: Neurology

## 2020-03-12 ENCOUNTER — Encounter: Payer: Self-pay | Admitting: Neurology

## 2020-03-12 ENCOUNTER — Other Ambulatory Visit: Payer: Self-pay

## 2020-03-12 VITALS — BP 113/74 | HR 64 | Ht 63.0 in | Wt 170.0 lb

## 2020-03-12 DIAGNOSIS — M792 Neuralgia and neuritis, unspecified: Secondary | ICD-10-CM | POA: Diagnosis not present

## 2020-03-12 MED ORDER — NORTRIPTYLINE HCL 10 MG PO CAPS: 10.0000 mg | ORAL_CAPSULE | Freq: Every day | ORAL | 3 refills | Status: AC

## 2020-03-12 NOTE — Patient Instructions (Addendum)
1.  Stop paroxetine.  Instead, start nortriptyline 10mg  at bedtime.  If burning not improved in 4 weeks, contact me and I will increase dose.  Otherwise, refill the medication. 2.  Follow up in 4 months.   1. Deje de paroxetina. En su lugar, comience a tomar nortriptilina 10 mg antes de acostarse. Si el ardor no mejora en 4 semanas, contcteme y aumentar la dosis. De lo contrario, vuelva a . 2. Seguimiento en 4 meses.

## 2020-03-13 IMAGING — DX DG SACRUM/COCCYX 2+V
3 series · 3 of 3 positions shown · non-contrast
Comparison: Lumbar spine radiograph dated 12/13/2014

CLINICAL DATA: 51-year-old female with fall and sacral pain.

EXAM:
SACRUM AND COCCYX - 2+ VIEW

[coccyx ap]
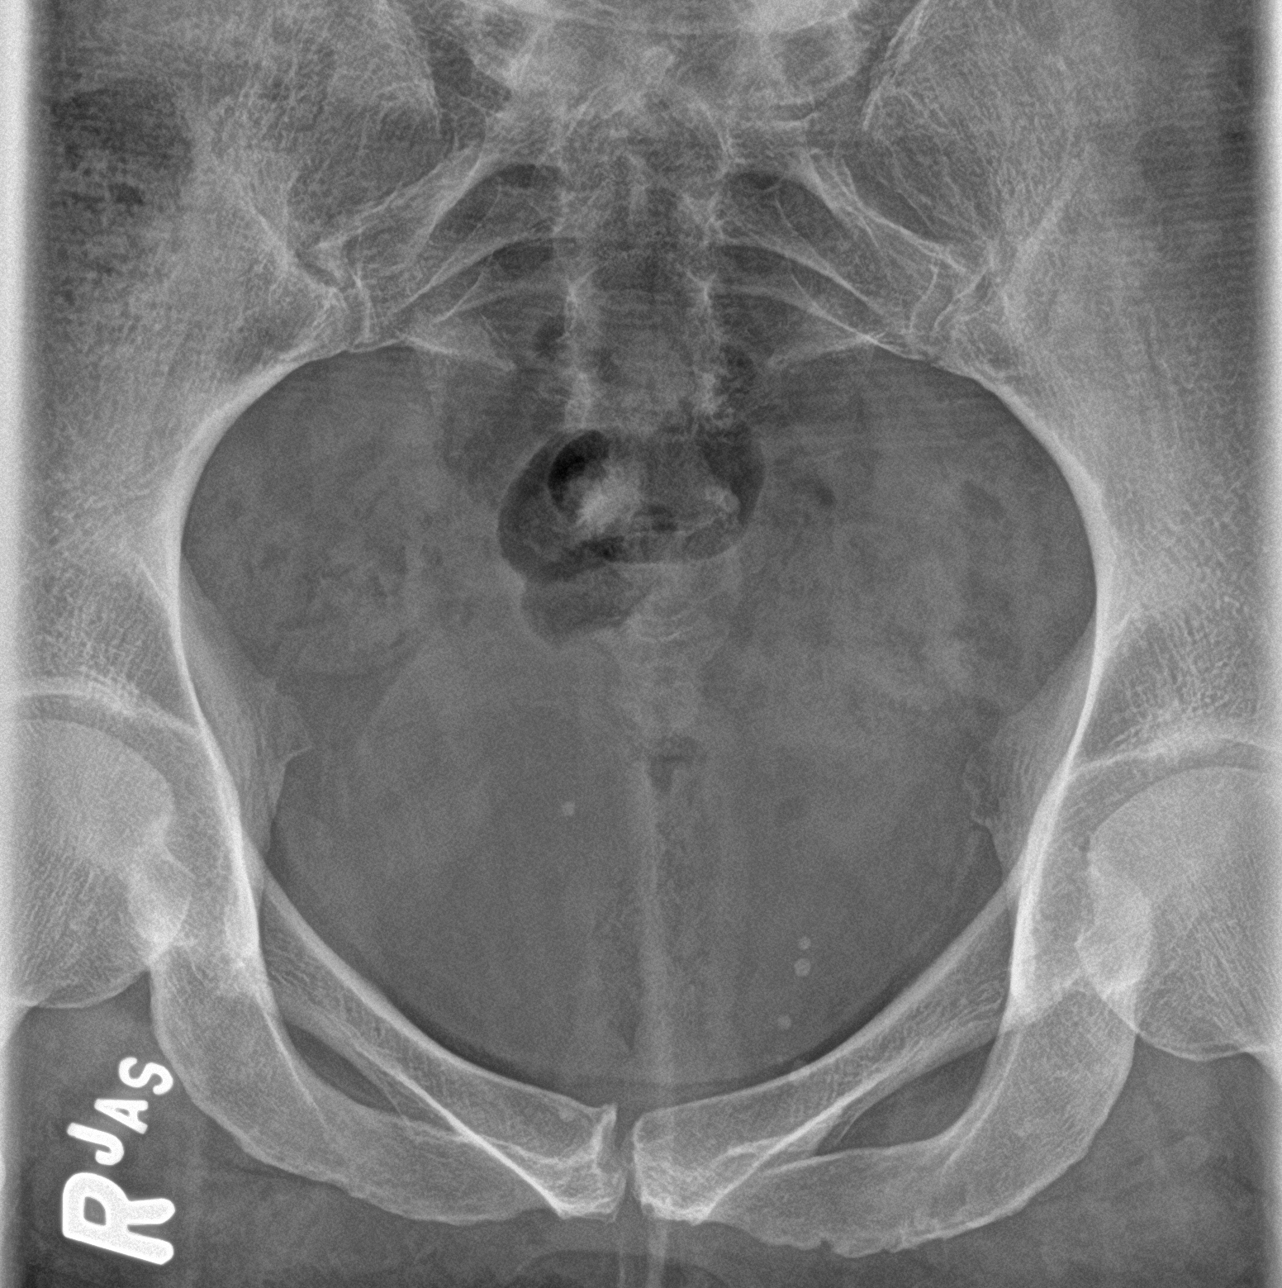

[sacrum ap]
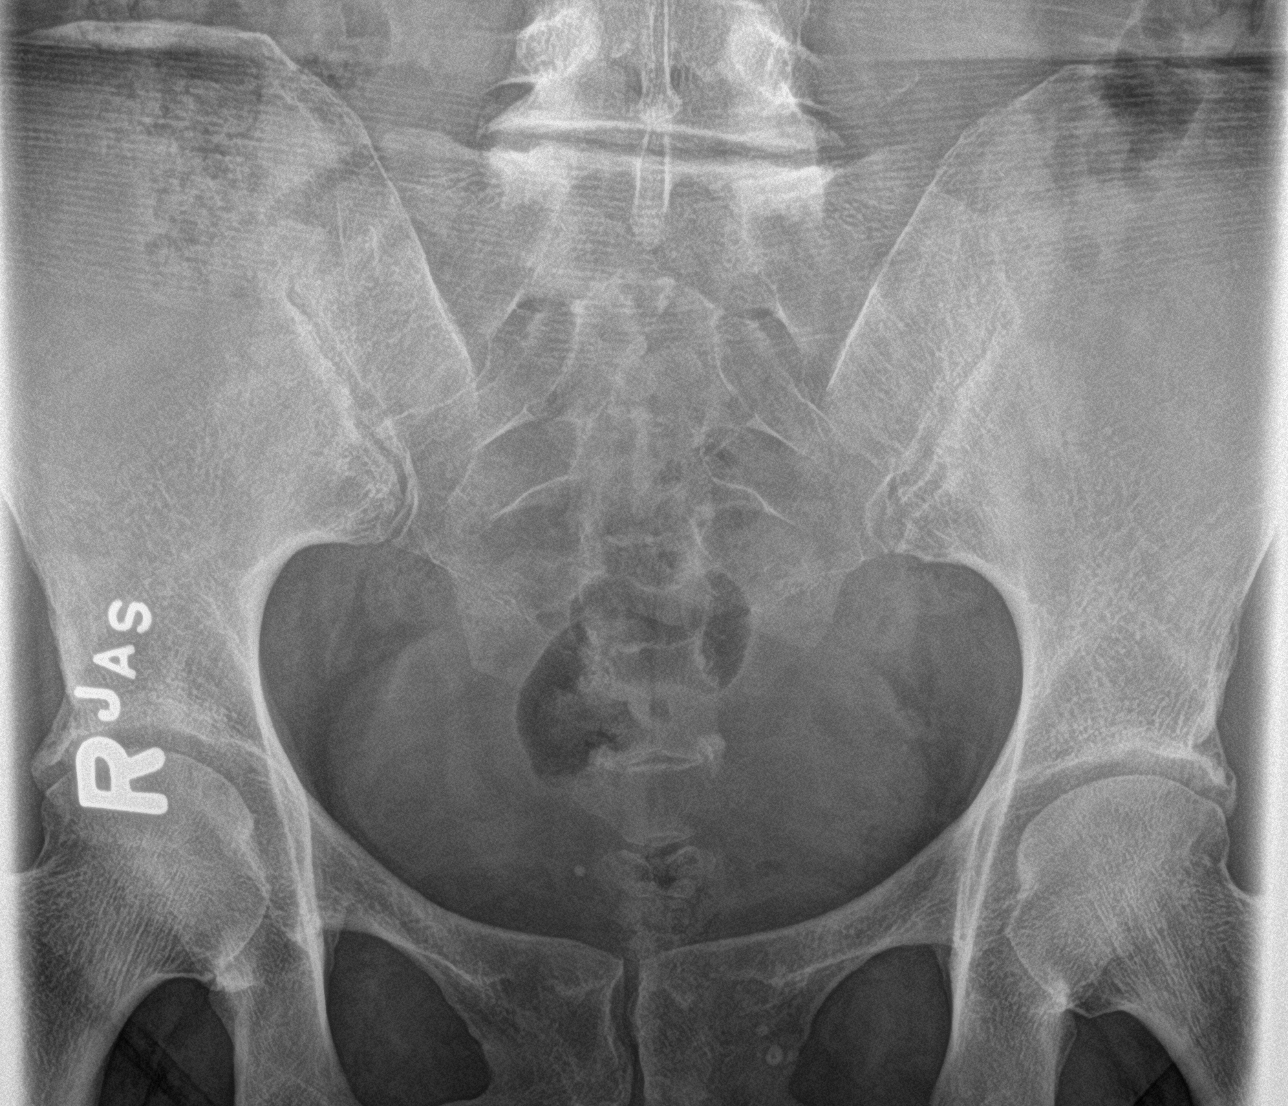

[sacrum lat]
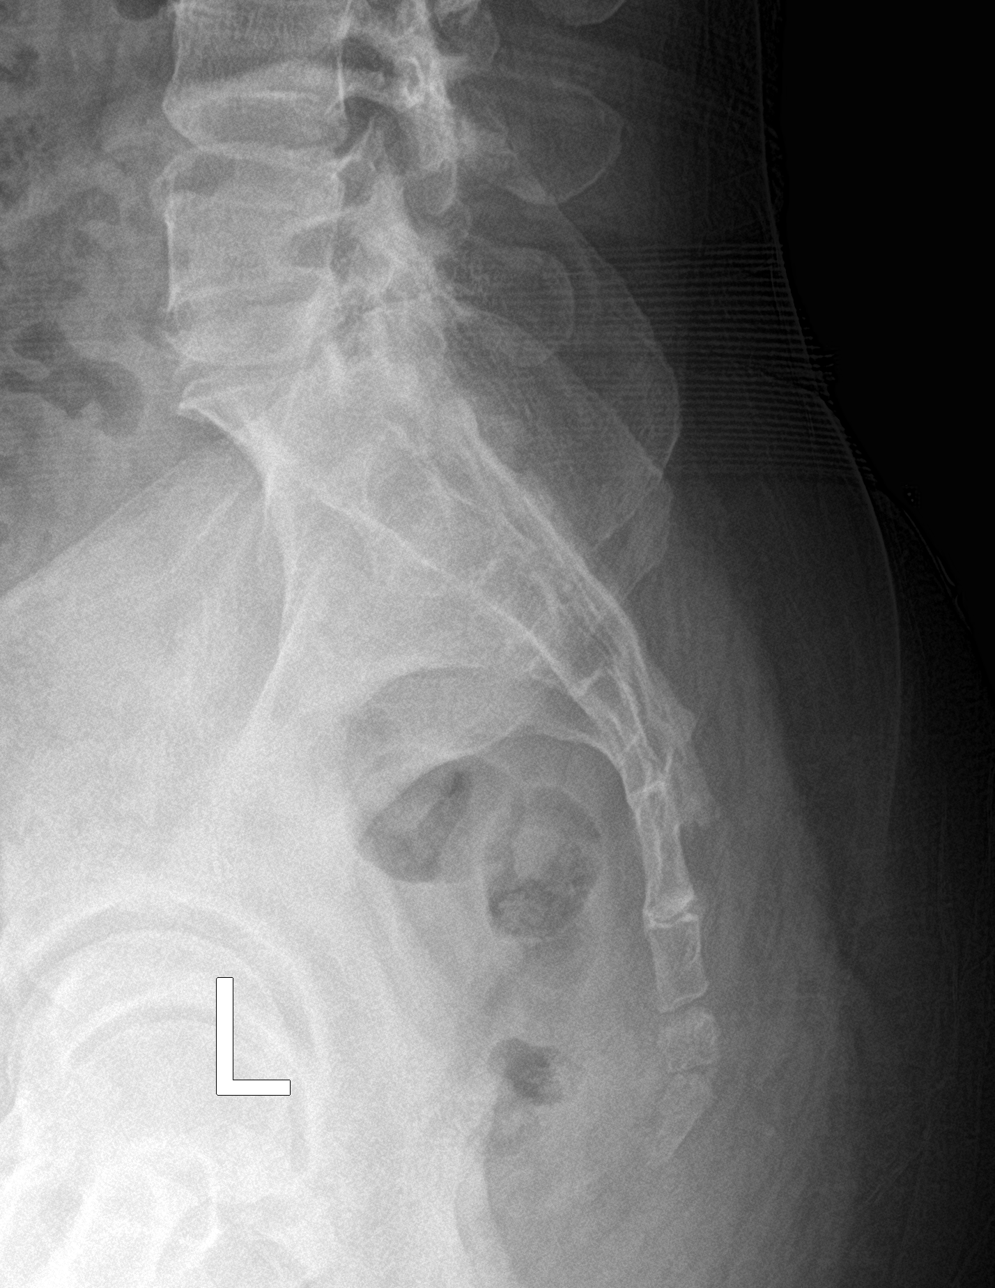

[3 of 3 positions shown; findings below may reference images not displayed]

FINDINGS: There is no evidence of fracture or other focal bone lesions.
IMPRESSION: Negative.

## 2020-03-26 ENCOUNTER — Telehealth: Payer: Self-pay | Admitting: Neurology

## 2020-03-26 NOTE — Telephone Encounter (Signed)
Patient came in the office with interpreter. She has had some side effects of her Nortriptyline. She has not been able to sleep in the last 3 days and it gives her a bad headache. She has stopped taking it and has gone back to taking what she has left of her Paroxetine for now. Please give her a call back with an interpreter preferably before 2:00 pm. She goes to work then.

## 2020-03-26 NOTE — Telephone Encounter (Signed)
Pt states she do not want to try anything else right now until she speaks to the provider about what is going on.  Advised pt that her f/u appt not until 08/2020 and that might be the earliest he has for right now.  Pt state she will wait.

## 2020-03-26 NOTE — Telephone Encounter (Signed)
We can start gabapentin 100mg  at bedtime.  If pain not improved in 4 weeks, she should contact and we can increase dose.

## 2020-08-16 NOTE — Progress Notes (Deleted)
NEUROLOGY FOLLOW UP OFFICE NOTE  Cathy Duran [id number]   Subjective:  Cathy Duran is a 54 year old right-handed female who follows up for scalp neuralgia.  Spanish-speaking interpreter present.  PCP note reviewed.  UPDATE: Current medications:  Gabapentin 100mg  at bedtime, paroxetine 10mg   In June, paroxetine was discontinued and she was started on nortriptyline 10mg .  However, she stopped after several days due to inability to sleep and returned to paroxetine.  Instead, she was started on gabapentin 100mg  at bedtime.  She only took for about a month because she ran out and didn't contact office for refills.  Her PCP restarted her on 300mg  at bedtime about 6 weeks ago.  ***  HISTORY: Since about 2019, she experiences burning and tingling sensation involving the entire right frontal and temporal region as well as top of head.  No actual headache and no neck pain.  It lasts 3 days and occurs once a week.  She also reports hair loss over the same period of time.  She reports that she has trouble sleeping but not due to the burning.  She reports work-related stress (works in [employer])  She was started on carbamazepine for presumed trigeminal neuralgia, which was subsequently changed to paroxetine, however she feels it is worse on paroxetine  MRI of brain with and without contrast on 08/23/2019 was normal.     Past medications:  Carbamazepine, nortriptyline 10mg  (caused insomnia), Robaxin  PAST MEDICAL HISTORY: Past Medical History:  Diagnosis Date  . High cholesterol   . Hypertension     MEDICATIONS: Current Outpatient Medications on File Prior to Visit  Medication Sig Dispense Refill  . atorvastatin (LIPITOR) 20 MG tablet Take 20 mg by mouth daily.    lisinopril (ZESTRIL) 10 MG tablet Take 10 mg by mouth daily.    . methocarbamol (ROBAXIN) 750 MG tablet Take 1 tablet (750 mg total) by mouth 3 (three) times daily as needed for muscle spasms (pain). 15 tablet 0  .  nortriptyline (PAMELOR) 10 MG capsule Take 1 capsule (10 mg total) by mouth at bedtime. 30 capsule 3   No current facility-administered medications on file prior to visit.    ALLERGIES: No Known Allergies  FAMILY HISTORY: Family History  Problem Relation Age of Onset  . Diabetes Mother   . Diabetes Father   . Hypertension Sister     SOCIAL HISTORY: Social History   Socioeconomic History  . Marital status: Married    Spouse name: Not on file  . Number of children: Not on file  . Years of education: Not on file  . Highest education level: Not on file  Occupational History  . Not on file  Tobacco Use  . Smoking status: Never Smoker  . Smokeless tobacco: Never Used  Substance and Sexual Activity  . Alcohol use: No  . Drug use: No  . Sexual activity: Yes    Birth control/protection: Surgical  Other Topics Concern  . Not on file  Social History Narrative   Right handed   Lives with husband one story   Social Determinants of Health   Financial Resource Strain:   . Difficulty of Paying Living Expenses: Not on file  Food Insecurity:   . Worried About in the Last Year: Not on file  . Ran Out of Food in the Last Year: Not on file  Transportation Needs:   . Lack of Transportation (Medical): Not on file  . Lack of Transportation (Non-Medical): Not  on file  Physical Activity:   . Days of Exercise per Week: Not on file  . Minutes of Exercise per Session: Not on file  Stress:   . Feeling of Stress : Not on file  Social Connections:   . Frequency of Communication with Friends and Family: Not on file  . Frequency of Social Gatherings with Friends and Family: Not on file  . Attends Religious Services: Not on file  . Active Member of Clubs or Organizations: Not on file  . Attends Banker Meetings: Not on file  . Marital Status: Not on file  Intimate Partner Violence:   . Fear of Current or Ex-Partner: Not on file  . Emotionally Abused: Not  on file  . Physically Abused: Not on file  . Sexually Abused: Not on file     Objective:  *** General: No acute distress.  Patient appears well-groomed.   Head:  Normocephalic/atraumatic Eyes:  Fundi examined but not visualized Neck: supple, no paraspinal tenderness, full range of motion Heart:  Regular rate and rhythm Lungs:  Clear to auscultation bilaterally Back: No paraspinal tenderness Neurological Exam: alert and oriented to person, place, and time. Attention span and concentration intact, recent and remote memory intact, fund of knowledge intact.  Speech fluent and not dysarthric, language intact.  CN II-XII intact. Bulk and tone normal, muscle strength 5/5 throughout.  Sensation to light touch, temperature and vibration intact.  Deep tendon reflexes 2+ throughout, toes downgoing.  Finger to nose and heel to shin testing intact.  Gait normal, Romberg negative.   Assessment/Plan:   Neuralgia of scalp  ***  Shon Millet, DO

## 2020-08-20 ENCOUNTER — Ambulatory Visit: Payer: Commercial Managed Care - PPO | Admitting: Neurology

## 2020-10-25 ENCOUNTER — Encounter: Payer: Self-pay | Admitting: Gastroenterology

## 2020-12-14 NOTE — Progress Notes (Deleted)
   NEUROLOGY FOLLOW UP OFFICE NOTE  Cathy Duran [id number]  Assessment/Plan:   Neuralgia involving scalp/face - unclear etiology  Subjective:  Cathy Duran is a 55 year old right-handed female who follows up for scalp neuralgia.  Spanish-speaking interpreter present.  UPDATE: Current medications:  Gabapentin 300mg  TID, lisinopril, atorvastatin.  In June, stopped paroxetine and started nortriptyline.  Stopped after several days because she said it caused difficulty sleeping and headache.  Started gabapentin 300mg  TID.    HISTORY: Since around 2019, she experiences burning and tingling sensation involving the entire right frontal and temporal region as well as top of head.  No actual headache and no neck pain.  It lasts 3 days and occurs once a week.  She also reports hair loss over the same period of time.  She reports that she has trouble sleeping but not due to the burning.  She reports work-related stress (works in )  She was started on carbamazepine for presumed trigeminal neuralgia, which was subsequently changed to paroxetine, however she feels it is worse on paroxetine  MRI of brain with and without contrast on 08/23/2019 was normal.     Past medications:  Carbamazepine, Robaxin, nortriptyline 10mg  (side effects - trouble sleeping, headache)  PAST MEDICAL HISTORY: Past Medical History:  Diagnosis Date  . High cholesterol   . Hypertension     MEDICATIONS: Current Outpatient Medications on File Prior to Visit  Medication Sig Dispense Refill  . atorvastatin (LIPITOR) 20 MG tablet Take 20 mg by mouth daily.    Musician lisinopril (ZESTRIL) 10 MG tablet Take 10 mg by mouth daily.    . methocarbamol (ROBAXIN) 750 MG tablet Take 1 tablet (750 mg total) by mouth 3 (three) times daily as needed for muscle spasms (pain). 15 tablet 0  . nortriptyline (PAMELOR) 10 MG capsule Take 1 capsule (10 mg total) by mouth at bedtime. 30 capsule 3   No current  facility-administered medications on file prior to visit.    ALLERGIES: No Known Allergies  FAMILY HISTORY: Family History  Problem Relation Age of Onset  . Diabetes Mother   . Diabetes Father   . Hypertension Sister       Objective:  *** General: No acute distress.  Patient appears ***-groomed.   Head:  Normocephalic/atraumatic Eyes:  Fundi examined but not visualized Neck: supple, no paraspinal tenderness, full range of motion Heart:  Regular rate and rhythm Lungs:  Clear to auscultation bilaterally Back: No paraspinal tenderness Neurological Exam: alert and oriented to person, place, and time. Attention span and concentration intact, recent and remote memory intact, fund of knowledge intact.  Speech fluent and not dysarthric, language intact.  CN II-XII intact. Bulk and tone normal, muscle strength 5/5 throughout.  Sensation to light touch, temperature and vibration intact.  Deep tendon reflexes 2+ throughout, toes downgoing.  Finger to nose and heel to shin testing intact.  Gait normal, Romberg negative.     14/04/2019, DO  CC: ***

## 2020-12-17 ENCOUNTER — Ambulatory Visit: Payer: Commercial Managed Care - PPO | Admitting: Neurology

## 2020-12-24 ENCOUNTER — Encounter: Payer: Commercial Managed Care - PPO | Admitting: Gastroenterology
